# Patient Record
Sex: Male | Born: 1955
Health system: Southern US, Community
[De-identification: ages and names within clinical notes are randomized; demographics above are authoritative.]

## PROBLEM LIST (undated history)

## (undated) DIAGNOSIS — L732 Hidradenitis suppurativa: Secondary | ICD-10-CM

## (undated) DIAGNOSIS — K219 Gastro-esophageal reflux disease without esophagitis: Secondary | ICD-10-CM

## (undated) DIAGNOSIS — M1990 Unspecified osteoarthritis, unspecified site: Secondary | ICD-10-CM

## (undated) HISTORY — DX: Hidradenitis suppurativa: L73.2

## (undated) HISTORY — PX: OTHER SURGICAL HISTORY: SHX169

## (undated) HISTORY — DX: Gastro-esophageal reflux disease without esophagitis: K21.9

## (undated) HISTORY — DX: Unspecified osteoarthritis, unspecified site: M19.90

---

## 2005-08-30 ENCOUNTER — Encounter: Admission: RE | Admit: 2005-08-30 | Discharge: 2005-08-30 | Payer: Self-pay | Admitting: Family Medicine

## 2006-01-30 ENCOUNTER — Ambulatory Visit: Payer: Self-pay | Admitting: Family Medicine

## 2007-11-08 ENCOUNTER — Ambulatory Visit: Payer: Self-pay | Admitting: Family Medicine

## 2007-11-11 ENCOUNTER — Ambulatory Visit: Payer: Self-pay | Admitting: Family Medicine

## 2007-11-25 ENCOUNTER — Ambulatory Visit: Payer: Self-pay | Admitting: Family Medicine

## 2008-07-21 ENCOUNTER — Ambulatory Visit: Payer: Self-pay | Admitting: Family Medicine

## 2009-06-23 ENCOUNTER — Ambulatory Visit: Payer: Self-pay | Admitting: Family Medicine

## 2010-05-03 ENCOUNTER — Ambulatory Visit: Payer: Self-pay | Admitting: Family Medicine

## 2010-06-26 HISTORY — PX: KNEE ARTHROSCOPY: SHX127

## 2010-09-05 ENCOUNTER — Ambulatory Visit (INDEPENDENT_AMBULATORY_CARE_PROVIDER_SITE_OTHER): Payer: BC Managed Care – PPO | Admitting: Family Medicine

## 2010-09-05 DIAGNOSIS — M129 Arthropathy, unspecified: Secondary | ICD-10-CM

## 2010-09-09 ENCOUNTER — Emergency Department (HOSPITAL_COMMUNITY): Payer: No Typology Code available for payment source

## 2010-09-09 ENCOUNTER — Emergency Department (HOSPITAL_COMMUNITY)
Admission: EM | Admit: 2010-09-09 | Discharge: 2010-09-09 | Disposition: A | Discharge status: Home / Self Care | Payer: No Typology Code available for payment source | Attending: Emergency Medicine | Admitting: Emergency Medicine

## 2010-09-09 DIAGNOSIS — T148XXA Other injury of unspecified body region, initial encounter: Secondary | ICD-10-CM | POA: Insufficient documentation

## 2010-09-09 DIAGNOSIS — Y929 Unspecified place or not applicable: Secondary | ICD-10-CM | POA: Insufficient documentation

## 2010-09-09 DIAGNOSIS — R109 Unspecified abdominal pain: Secondary | ICD-10-CM | POA: Insufficient documentation

## 2010-09-09 DIAGNOSIS — M25569 Pain in unspecified knee: Secondary | ICD-10-CM | POA: Insufficient documentation

## 2010-09-09 DIAGNOSIS — M545 Low back pain, unspecified: Secondary | ICD-10-CM | POA: Insufficient documentation

## 2010-09-09 DIAGNOSIS — S0990XA Unspecified injury of head, initial encounter: Secondary | ICD-10-CM | POA: Insufficient documentation

## 2010-09-09 DIAGNOSIS — M79609 Pain in unspecified limb: Secondary | ICD-10-CM | POA: Insufficient documentation

## 2010-09-09 DIAGNOSIS — S93409A Sprain of unspecified ligament of unspecified ankle, initial encounter: Secondary | ICD-10-CM | POA: Insufficient documentation

## 2010-09-09 DIAGNOSIS — M1990 Unspecified osteoarthritis, unspecified site: Secondary | ICD-10-CM | POA: Insufficient documentation

## 2010-09-09 DIAGNOSIS — M542 Cervicalgia: Secondary | ICD-10-CM | POA: Insufficient documentation

## 2010-09-12 ENCOUNTER — Ambulatory Visit (INDEPENDENT_AMBULATORY_CARE_PROVIDER_SITE_OTHER): Payer: BC Managed Care – PPO | Admitting: Family Medicine

## 2011-08-07 ENCOUNTER — Emergency Department (HOSPITAL_COMMUNITY)
Admission: EM | Admit: 2011-08-07 | Discharge: 2011-08-07 | Disposition: A | Discharge status: Home / Self Care | Payer: Worker's Compensation | Attending: Emergency Medicine | Admitting: Emergency Medicine

## 2011-08-07 ENCOUNTER — Encounter (HOSPITAL_COMMUNITY): Payer: Self-pay

## 2011-08-07 DIAGNOSIS — H10219 Acute toxic conjunctivitis, unspecified eye: Secondary | ICD-10-CM | POA: Insufficient documentation

## 2011-08-07 DIAGNOSIS — H5710 Ocular pain, unspecified eye: Secondary | ICD-10-CM | POA: Insufficient documentation

## 2011-08-07 MED ORDER — TETRACAINE HCL 0.5 % OP SOLN
OPHTHALMIC | Status: AC
  Administered 2011-08-07: 1 [drp]
  Filled 2011-08-07: qty 2

## 2011-08-07 MED ORDER — FLUORESCEIN SODIUM 1 MG OP STRP
ORAL_STRIP | OPHTHALMIC | Status: AC
  Administered 2011-08-07: 08:00:00
  Filled 2011-08-07: qty 1

## 2011-08-07 NOTE — ED Notes (Signed)
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

## 2011-08-07 NOTE — ED Notes (Signed)
Pt presents to department for evaluation of R eye injury. States he was working on pipe this morning when liquid got into eye. States this could of been some type of grease or oil. Eye noted to be red and watery upon exam. Pt also states blurred vision and discomfort. 5/10 at the time. He is alert and oriented x4. No signs of acute distress at the time.

## 2011-08-07 NOTE — ED Provider Notes (Signed)
History     CSN: [id number]  Arrival date & time 08/07/11  6962   None     Chief Complaint  Patient presents with  . Eye Problem    (Consider location/radiation/quality/duration/timing/severity/associated sxs/prior treatment) HPI Complains of right eye stinging sensation onset 4:30 AM today when a type burst and liquid diet and his right eye. No other injury no other complaint treated with rinsing his eye out immediately. No other associated symptoms. No other injury History reviewed. No pertinent past medical history. Past medical history negative History reviewed. No pertinent past surgical history.  History reviewed. No pertinent family history.  History  Substance Use Topics  . Smoking status: Never Smoker   . Smokeless tobacco: Not on file  . Alcohol Use: No      Review of Systems  Constitutional: Negative.   Eyes:       Eye pain  Respiratory: Negative.   Hematological: Negative.   Psychiatric/Behavioral: Negative.     Allergies  Review of patient's allergies indicates no known allergies.  Home Medications  No current outpatient prescriptions on file.  BP 162/94  Pulse 79  Temp(Src) 97.9 F (36.6 C) (Oral)  Resp 20  SpO2 98%  Physical Exam  Nursing note and vitals reviewed. Constitutional: He appears well-developed and well-nourished.  HENT:  Head: Normocephalic and atraumatic.  Eyes: EOM are normal. Pupils are equal, round, and reactive to light.  Neck: Normal range of motion.  Cardiovascular: Normal rate.   Pulmonary/Chest: Effort normal.  Abdominal: He exhibits no distension.  Musculoskeletal: Normal range of motion.  Neurological: He is alert. No cranial nerve deficit. Coordination normal.  Skin: Skin is warm and dry.  Psychiatric: He has a normal mood and affect. His behavior is normal.    ED Course  Procedures (including critical care time) I your did with 1 L of saline with Lequita Halt lens. After I your did pH checked 7.5 approximATELY,  fluorescein past negative Labs Reviewed - No data to display No results found.   No diagnosis found.    MDM  Plan followup Worker's Comp. Doctor Diagnosis chemical conjunctivitis        Doug Sou, MD 08/07/11 [phone]

## 2011-10-16 ENCOUNTER — Ambulatory Visit (INDEPENDENT_AMBULATORY_CARE_PROVIDER_SITE_OTHER): Payer: BC Managed Care – PPO | Admitting: Family Medicine

## 2011-10-16 ENCOUNTER — Encounter: Payer: Self-pay | Admitting: Family Medicine

## 2011-10-16 VITALS — BP 130/78 | HR 77 | Wt 230.0 lb

## 2011-10-16 DIAGNOSIS — M25561 Pain in right knee: Secondary | ICD-10-CM

## 2011-10-16 DIAGNOSIS — M25569 Pain in unspecified knee: Secondary | ICD-10-CM

## 2011-10-16 NOTE — Patient Instructions (Signed)
Take 2 Tylenol 4 times a day. You can also take 4 Advil 3 times a day. If that doesn't work call me and we'll try a different medicine. If that doesn't work then we'll inject.

## 2011-10-16 NOTE — Progress Notes (Signed)
  Subjective:    Patient ID: Travis Solomon, male    DOB: [birth year], 56 y.o.   MRN: [id number]  HPI 3 days ago he developed right knee pain. No history of injury to the knee. No popping, locking, grinding or swelling. 2 days later the left medial knee started to give him trouble. He has had previous surgery on that knee. No other joints bother him. He has been taking 2 or 3 Advil per day. Apparently he has had x-rays on his knees at the orthopedic office and arthritis was mentioned. He is interested in having an injection.  Review of Systems     Objective:   Physical Exam Alert and in no distress. Exam of the right knee does show a small effusion. Anterior drawer is negative. McMurray's testing normal. Slight lipping is noted of the joints.       Assessment & Plan:  Right knee pain, probable arthritis. I explained that an injection would not be appropriate at this time. We need to start with Tylenol and add an anti-inflammatory in maximum dosing. Discussed possibility of doing an injection but at a later date after retry at least one other anti-inflammatory. He was disappointed with this.

## 2013-09-23 ENCOUNTER — Telehealth: Payer: Self-pay

## 2013-09-23 NOTE — Telephone Encounter (Signed)
Left message for call back Non identifiable No pertinent data

## 2013-09-24 ENCOUNTER — Encounter: Payer: Self-pay | Admitting: Internal Medicine

## 2013-09-24 ENCOUNTER — Ambulatory Visit (INDEPENDENT_AMBULATORY_CARE_PROVIDER_SITE_OTHER): Payer: BC Managed Care – PPO | Admitting: Internal Medicine

## 2013-09-24 VITALS — BP 135/79 | HR 66 | Temp 97.9°F | Ht 70.2 in | Wt 239.0 lb

## 2013-09-24 DIAGNOSIS — R079 Chest pain, unspecified: Secondary | ICD-10-CM | POA: Insufficient documentation

## 2013-09-24 NOTE — Assessment & Plan Note (Addendum)
2 weeks history of chest pain, atypical. EKG normal sinus rhythm. Cardiovascular risk factors are low, lipids unknown. Given lack of competing  diagnosis I will proceed with a stress test (pt states treadmill is ok) and a chest x-ray. Labs  See instructions.

## 2013-09-24 NOTE — Patient Instructions (Signed)
Get the XR at [facility] [place], corner [address] (10 minutes form here); they are open 24/7 [address]  [place], [place] [address] [phone]  Continue with  aspirin Call or go to the ER  if you have severe or intense or persistent chest pain.  Come back for a checkup in 2 months

## 2013-09-24 NOTE — Progress Notes (Signed)
   Subjective:    Patient ID: Travis Solomon, male    DOB: [birth year], 58 y.o.   MRN: [id number]  DOS:  09/24/2013 Type of  Visit: New patient Patient is here concerned about chest pain. Symptoms started approximately 2 weeks ago, pain is on and off, last for a few seconds to a few minutes. Usually at rest or when he is driving. No change in sx w/ exercise, walking, food intake, deep breaths. He had similar symptoms 2 years ago (?)  but now workup apparently was done. No associated nausea, vomiting, palpitations. Pain does not radiate.   ROS Denies fever, chills. No cough. Denies heartburn but when asked, admits to occasional dysphagia. Note nausea, vomiting, blood in the stools. He is a [employer] architecttruck driver, he denies any calf swelling or pain.  History reviewed. No pertinent past medical history.  Past Surgical History  Procedure Laterality Date  . Knee arthroscopy  2012    History   Social History  . Marital Status: Married    Spouse Name: N/A    Number of Children: 3  . Years of Education: N/A   Occupational History  . truck driver     Social History Main Topics  . Smoking status: Never Smoker   . Smokeless tobacco: Never Used  . Alcohol Use: No  . Drug Use: No  . Sexual Activity: Not on file   Other Topics Concern  . Not on file   Social History Narrative  . No narrative on file     Family History  Problem Relation Age of Onset  . CAD Neg Hx   . Stroke Neg Hx   . Diabetes Other     mother side        Medication List       This list is accurate as of: 09/24/13  6:30 PM.  Always use your most recent med list.               aspirin 325 MG tablet  Take 325 mg by mouth daily.     ibuprofen 200 MG tablet  Commonly known as:  ADVIL,MOTRIN  Take 200 mg by mouth every 6 (six) hours as needed. For pain           Objective:   Physical Exam BP 135/79  Pulse 66  Temp(Src) 97.9 F (36.6 C)  Ht 5' 10.2" (1.783 m)  Wt 239 lb (108.41 kg)  BMI  34.10 kg/m2  SpO2 96% General -- alert, well-developed, NAD.  Neck --no thyromegaly   HEENT-- Not pale.  Lungs -- normal respiratory effort, no intercostal retractions, no accessory muscle use, and normal breath sounds.  Chest wall -- No TTP Heart-- normal rate, regular rhythm, no murmur.  Abdomen-- Not distended, good bowel sounds,soft, non-tender.  Extremities-- no pretibial edema bilaterally  Neurologic--  alert & oriented X3. Speech normal, gait normal, strength normal in all extremities.  Psych-- Cognition and judgment appear intact. Cooperative with normal attention span and concentration. No anxious or depressed appearing.       Assessment & Plan:

## 2013-09-24 NOTE — Progress Notes (Signed)
Pre visit review using our clinic review tool, if applicable. No additional management support is needed unless otherwise documented below in the visit note. 

## 2013-09-25 ENCOUNTER — Telehealth: Payer: Self-pay | Admitting: *Deleted

## 2013-09-25 LAB — CBC WITH DIFFERENTIAL/PLATELET
Basophils Absolute: 0 10*3/uL (ref 0.0–0.1)
Basophils Relative: 0.2 % (ref 0.0–3.0)
EOS PCT: 4.8 % (ref 0.0–5.0)
Eosinophils Absolute: 0.3 10*3/uL (ref 0.0–0.7)
HEMATOCRIT: 41.3 % (ref 39.0–52.0)
Hemoglobin: 12.9 g/dL — ABNORMAL LOW (ref 13.0–17.0)
Lymphocytes Relative: 42.9 % (ref 12.0–46.0)
Lymphs Abs: 2.6 10*3/uL (ref 0.7–4.0)
MCHC: 31.3 g/dL (ref 30.0–36.0)
MCV: 71.4 fl — AB (ref 78.0–100.0)
MONOS PCT: 8.5 % (ref 3.0–12.0)
Monocytes Absolute: 0.5 10*3/uL (ref 0.1–1.0)
NEUTROS PCT: 43.6 % (ref 43.0–77.0)
Neutro Abs: 2.6 10*3/uL (ref 1.4–7.7)
PLATELETS: 351 10*3/uL (ref 150.0–400.0)
RBC: 5.78 Mil/uL (ref 4.22–5.81)
RDW: 15.7 % — AB (ref 11.5–14.6)
WBC: 6 10*3/uL (ref 4.5–10.5)

## 2013-09-25 LAB — BASIC METABOLIC PANEL
BUN: 12 mg/dL (ref 6–23)
CHLORIDE: 101 meq/L (ref 96–112)
CO2: 28 mEq/L (ref 19–32)
CREATININE: 0.9 mg/dL (ref 0.4–1.5)
Calcium: 9.4 mg/dL (ref 8.4–10.5)
GFR: 110.69 mL/min (ref >60.00)
GLUCOSE: 78 mg/dL (ref 70–99)
POTASSIUM: 4.5 meq/L (ref 3.5–5.1)
Sodium: 138 mEq/L (ref 135–145)

## 2013-09-25 NOTE — Telephone Encounter (Signed)
Appt. Scheduled @ [facility] office. Left message for patient to call office.

## 2013-09-25 NOTE — Telephone Encounter (Signed)
Unable to reach prior to visit  

## 2013-09-25 NOTE — Telephone Encounter (Signed)
Linda from [facility] called regarding pts stress test unable to schedule him until MAY, recommended another [facility] [address] that can schedule him possibly next week.

## 2013-09-30 ENCOUNTER — Telehealth: Payer: Self-pay | Admitting: *Deleted

## 2013-09-30 ENCOUNTER — Other Ambulatory Visit (INDEPENDENT_AMBULATORY_CARE_PROVIDER_SITE_OTHER): Payer: BC Managed Care – PPO

## 2013-09-30 DIAGNOSIS — D649 Anemia, unspecified: Secondary | ICD-10-CM

## 2013-09-30 NOTE — Telephone Encounter (Signed)
Release of medical information signed by patient and faxed to pts last physicians [facility].

## 2013-10-01 LAB — COMPREHENSIVE METABOLIC PANEL
ALT: 18 U/L (ref 0–53)
AST: 21 U/L (ref 0–37)
Albumin: 4 g/dL (ref 3.5–5.2)
Alkaline Phosphatase: 53 U/L (ref 39–117)
BILIRUBIN TOTAL: 0.4 mg/dL (ref 0.3–1.2)
BUN: 11 mg/dL (ref 6–23)
CALCIUM: 9.3 mg/dL (ref 8.4–10.5)
CO2: 27 meq/L (ref 19–32)
CREATININE: 1 mg/dL (ref 0.4–1.5)
Chloride: 102 mEq/L (ref 96–112)
GFR: 105.32 mL/min (ref >60.00)
Glucose, Bld: 85 mg/dL (ref 70–99)
Potassium: 4.2 mEq/L (ref 3.5–5.1)
SODIUM: 140 meq/L (ref 135–145)
TOTAL PROTEIN: 7.4 g/dL (ref 6.0–8.3)

## 2013-10-01 LAB — FERRITIN: Ferritin: 188.2 ng/mL (ref 22.0–322.0)

## 2013-10-01 LAB — IRON: Iron: 67 ug/dL (ref 42–165)

## 2013-10-02 ENCOUNTER — Inpatient Hospital Stay (HOSPITAL_COMMUNITY): Admission: RE | Admit: 2013-10-02 | Payer: Self-pay | Source: Ambulatory Visit

## 2013-10-22 ENCOUNTER — Telehealth: Payer: Self-pay | Admitting: Internal Medicine

## 2013-10-22 NOTE — Telephone Encounter (Signed)
Fax[facility] records [facility] @ [phone]

## 2013-10-23 ENCOUNTER — Telehealth (HOSPITAL_COMMUNITY): Payer: Self-pay

## 2013-10-28 ENCOUNTER — Ambulatory Visit (HOSPITAL_COMMUNITY)
Admission: RE | Admit: 2013-10-28 | Discharge: 2013-10-28 | Disposition: A | Discharge status: Home / Self Care | Payer: BC Managed Care – PPO | Source: Ambulatory Visit | Attending: Cardiovascular Disease | Admitting: Cardiovascular Disease

## 2013-10-28 DIAGNOSIS — R079 Chest pain, unspecified: Secondary | ICD-10-CM

## 2013-10-29 ENCOUNTER — Telehealth: Payer: Self-pay | Admitting: Internal Medicine

## 2013-10-29 NOTE — Telephone Encounter (Signed)
Spoke with patient and the pain has gone away and he feels better. He will call us and let us know if there are any changes.      KP

## 2013-10-29 NOTE — Telephone Encounter (Signed)
Advise patient, stress test negative, patient to let me know if he continue w/ CP. Good results

## 2013-10-29 NOTE — Telephone Encounter (Signed)
MSG left to call the office      KP 

## 2013-11-04 ENCOUNTER — Telehealth: Payer: Self-pay | Admitting: Family Medicine

## 2013-11-04 NOTE — Telephone Encounter (Signed)
Medical records sent to [facility]

## 2013-11-24 ENCOUNTER — Encounter: Payer: Self-pay | Admitting: Internal Medicine

## 2013-11-24 ENCOUNTER — Ambulatory Visit (INDEPENDENT_AMBULATORY_CARE_PROVIDER_SITE_OTHER): Payer: BC Managed Care – PPO | Admitting: Internal Medicine

## 2013-11-24 VITALS — BP 124/78 | HR 75 | Temp 98.2°F | Wt 243.0 lb

## 2013-11-24 DIAGNOSIS — R079 Chest pain, unspecified: Secondary | ICD-10-CM

## 2013-11-24 DIAGNOSIS — M1990 Unspecified osteoarthritis, unspecified site: Secondary | ICD-10-CM

## 2013-11-24 HISTORY — DX: Unspecified osteoarthritis, unspecified site: M19.90

## 2013-11-24 NOTE — Patient Instructions (Signed)
Next visit is for a physical exam in 6-8 months   fasting Please make an appointment

## 2013-11-24 NOTE — Assessment & Plan Note (Signed)
We had a long conversation about DJD, he has seen Dr. Juliene Pina  before and had local injections, they already talked about surgery but he's not interested at this time, unless  is minimally invasive surgery. For now I recommend conservative treatment with Tylenol or Motrin, call for a referral to [facility] if he likes a second opinion

## 2013-11-24 NOTE — Progress Notes (Signed)
Pre visit review using our clinic review tool, if applicable. No additional management support is needed unless otherwise documented below in the visit note. 

## 2013-11-24 NOTE — Assessment & Plan Note (Addendum)
Stress test was negative, chest pain is resolved. He did not have a CXR done but since he is now asymptomatic won't pursue further evaluation unless symptoms resurface.

## 2013-11-24 NOTE — Progress Notes (Signed)
   Subjective:    Patient ID: Travis Solomon, male    DOB: January 14, 1956, 58 y.o.   MRN: [id number]  DOS:  11/24/2013 Type of  Visit: Followup from previous visit History: As the last time he was here, labs were okay, stress test was negative, no further testing. Also he is somehow concerned about knee DJD. See assessment and plan   ROS Denies any fever or chills No weight loss, no cough. No acid reflux   Past Medical History  Diagnosis Date  . DJD (degenerative joint disease) 11/24/2013    Past Surgical History  Procedure Laterality Date  . Knee arthroscopy  2012    History   Social History  . Marital Status: Married    Spouse Name: N/A    Number of Children: 3  . Years of Education: N/A   Occupational History  . truck driver     Social History Main Topics  . Smoking status: Never Smoker   . Smokeless tobacco: Never Used  . Alcohol Use: No  . Drug Use: No  . Sexual Activity: Not on file   Other Topics Concern  . Not on file   Social History Narrative  . No narrative on file        Medication List       This list is accurate as of: 11/24/13 10:03 PM.  Always use your most recent med list.               aspirin 325 MG tablet  Take 325 mg by mouth daily.     ibuprofen 200 MG tablet  Commonly known as:  ADVIL,MOTRIN  Take 200 mg by mouth every 6 (six) hours as needed. For pain           Objective:   Physical Exam BP 124/78  Pulse 75  Temp(Src) 98.2 F (36.8 C)  Wt 243 lb (110.224 kg)  SpO2 97%  General -- alert, well-developed, NAD.  Neck --no mass HEENT-- Not pale.  Lungs -- normal respiratory effort, no intercostal retractions, no accessory muscle use, and normal breath sounds.  Heart-- normal rate, regular rhythm, no murmur.   Extremities-- no pretibial edema bilaterally . + deformities c/w DJD at knees  Neurologic--  alert & oriented X3. Speech normal, gait appropriate for age but limited by DJD  Psych-- Cognition and judgment  appear intact. Cooperative with normal attention span and concentration. No anxious or depressed appearing.       Assessment & Plan:

## 2013-12-25 NOTE — Telephone Encounter (Signed)
Encounter complete. 

## 2014-07-20 ENCOUNTER — Encounter: Payer: Self-pay | Admitting: Internal Medicine

## 2015-07-01 ENCOUNTER — Telehealth: Payer: Self-pay | Admitting: Internal Medicine

## 2015-07-01 NOTE — Telephone Encounter (Signed)
error:[id number] ° °

## 2015-07-02 ENCOUNTER — Other Ambulatory Visit: Payer: Self-pay | Admitting: Medical

## 2015-07-02 ENCOUNTER — Encounter (INDEPENDENT_AMBULATORY_CARE_PROVIDER_SITE_OTHER): Payer: Self-pay

## 2015-07-02 ENCOUNTER — Ambulatory Visit (INDEPENDENT_AMBULATORY_CARE_PROVIDER_SITE_OTHER): Payer: Managed Care, Other (non HMO) | Admitting: Medical

## 2015-07-02 ENCOUNTER — Encounter: Payer: Self-pay | Admitting: Medical

## 2015-07-02 VITALS — BP 130/88 | HR 78 | Temp 97.8°F | Ht 70.0 in | Wt 246.0 lb

## 2015-07-02 DIAGNOSIS — R0781 Pleurodynia: Secondary | ICD-10-CM | POA: Diagnosis not present

## 2015-07-02 DIAGNOSIS — R10A0 Flank pain, unspecified side: Secondary | ICD-10-CM

## 2015-07-02 DIAGNOSIS — R109 Unspecified abdominal pain: Secondary | ICD-10-CM | POA: Diagnosis not present

## 2015-07-02 DIAGNOSIS — R0789 Other chest pain: Secondary | ICD-10-CM

## 2015-07-02 LAB — COMPREHENSIVE METABOLIC PANEL
ALBUMIN: 4.4 g/dL (ref 3.6–5.1)
ALK PHOS: 71 U/L (ref 40–115)
ALT: 17 U/L (ref 9–46)
AST: 19 U/L (ref 10–35)
BUN: 15 mg/dL (ref 7–25)
CALCIUM: 9.2 mg/dL (ref 8.6–10.3)
CO2: 30 mmol/L (ref 20–31)
Chloride: 101 mmol/L (ref 98–110)
Creat: 0.99 mg/dL (ref 0.70–1.33)
Glucose, Bld: 87 mg/dL (ref 65–99)
POTASSIUM: 4.7 mmol/L (ref 3.5–5.3)
Sodium: 138 mmol/L (ref 135–146)
TOTAL PROTEIN: 7.3 g/dL (ref 6.1–8.1)
Total Bilirubin: 0.4 mg/dL (ref 0.2–1.2)

## 2015-07-02 LAB — CBC WITH DIFFERENTIAL/PLATELET
BASOS PCT: 0 % (ref 0–1)
Basophils Absolute: 0 10*3/uL (ref 0.0–0.1)
Eosinophils Absolute: 0.2 10*3/uL (ref 0.0–0.7)
Eosinophils Relative: 3 % (ref 0–5)
HCT: 41.6 % (ref 39.0–52.0)
HEMOGLOBIN: 13.4 g/dL (ref 13.0–17.0)
Lymphocytes Relative: 26 % (ref 12–46)
Lymphs Abs: 2.2 10*3/uL (ref 0.7–4.0)
MCH: 22.4 pg — ABNORMAL LOW (ref 26.0–34.0)
MCHC: 32.2 g/dL (ref 30.0–36.0)
MCV: 69.7 fL — ABNORMAL LOW (ref 78.0–100.0)
MONO ABS: 0.8 10*3/uL (ref 0.1–1.0)
MPV: 8 fL — AB (ref 8.6–12.4)
Monocytes Relative: 10 % (ref 3–12)
NEUTROS ABS: 5.1 10*3/uL (ref 1.7–7.7)
Neutrophils Relative %: 61 % (ref 43–77)
Platelets: 303 10*3/uL (ref 150–400)
RBC: 5.97 MIL/uL — AB (ref 4.22–5.81)
RDW: 16.5 % — AB (ref 11.5–15.5)
WBC: 8.3 10*3/uL (ref 4.0–10.5)

## 2015-07-02 MED ORDER — TRAMADOL HCL 50 MG PO TABS: 50.0000 mg | ORAL_TABLET | Freq: Three times a day (TID) | ORAL | Status: DC | PRN

## 2015-07-02 MED FILL — traMADol HCL 50 MG TABS: 50 | 5 days supply | Qty: 16 | Fill #0

## 2015-07-02 NOTE — Patient Instructions (Addendum)
For rib area pain and flank area pain continue take tylenol. Can continue flexeril. For moderate to severe pain rx tramadol.  Since on exam you have some slight lower rib area pain will get xray.  I will put in order to get cmp and cbc next week Monday or Tuesday when we are open depending on weather. Lab was closed at time of visit.  If your symptoms worsen or change over weekend then ED evaluation.  Above was my plan. But on the way out he was able to get blood work but then did not get xray. When I called to clarify when his pain started he mentioned he did not get xray. So I advised him by Monday or Tuesday any pain persisting then get xray. Orders are in computer and he does not need appointment.

## 2015-07-02 NOTE — Progress Notes (Signed)
Subjective:    Patient ID: Travis Solomon, male    DOB: Nov 09, 1955, 60 y.o.   MRN: [id number]  HPI   Pt in with some left side back pain/lower rib area for one week. Pt states felt pain at work bending over holding some items around 40-50 pounds. Pt saw workers comp MD. He was told muscle strain. Pt is urinating fine. No abdomen pain. No nausea, no vomiting, or fever.   Pt was given a muscle relaxer and ibuprofen. When he lays down on his side feels burn. Pain some improved.  Review of Systems  Constitutional: Negative for fever, chills and fatigue.  Respiratory: Negative for cough, chest tightness, shortness of breath and wheezing.   Cardiovascular: Negative for chest pain and palpitations.  Gastrointestinal: Negative for nausea, vomiting, abdominal pain, diarrhea, blood in stool and abdominal distention.  Genitourinary: Negative for dysuria, urgency, frequency, flank pain, penile swelling and penile pain.       Urinating normal.  Musculoskeletal:       See hpi.  Skin: Negative for rash.  Neurological: Negative for dizziness and headaches.  Hematological: Negative for adenopathy. Does not bruise/bleed easily.  Psychiatric/Behavioral: Negative for behavioral problems.    Past Medical History  Diagnosis Date  . DJD (degenerative joint disease) 11/24/2013    Social History   Social History  . Marital Status: Married    Spouse Name: N/A  . Number of Children: 3  . Years of Education: N/A   Occupational History  . truck driver     Social History Main Topics  . Smoking status: Never Smoker   . Smokeless tobacco: Never Used  . Alcohol Use: No  . Drug Use: No  . Sexual Activity: Not on file   Other Topics Concern  . Not on file   Social History Narrative    Past Surgical History  Procedure Laterality Date  . Knee arthroscopy  2012    Family History  Problem Relation Age of Onset  . CAD Neg Hx   . Stroke Neg Hx   . Diabetes Other     mother side      No Known Allergies  Current Outpatient Prescriptions on File Prior to Visit  Medication Sig Dispense Refill  . aspirin 325 MG tablet Take 325 mg by mouth daily.    Marland Kitchen ibuprofen (ADVIL,MOTRIN) 200 MG tablet Take 200 mg by mouth every 6 (six) hours as needed. For pain     No current facility-administered medications on file prior to visit.    BP 130/88 mmHg  Pulse 78  Temp(Src) 97.8 F (36.6 C) (Oral)  Ht 5\' 10"  (1.778 m)  Wt 246 lb (111.585 kg)  BMI 35.30 kg/m2  SpO2 97%       Objective:   Physical Exam  General  Mental Status - Alert. General Appearance - Well groomed. Not in acute distress.  Skin Rashes- No Rashes.    Neck Neck- Supple. No Masses.   Chest and Lung Exam Auscultation: Breath Sounds:- even and unlabored,  Cardiovascular Auscultation:Rythm- Regular, rate and rhythm. Murmurs & Other Heart Sounds:Ausculatation of the heart reveal- No Murmurs.  Lymphatic Head & Neck General Head & Neck Lymphatics: Bilateral: Description- No Localized lymphadenopathy.  Abdomen Inspection:-Inspection Normal.  Palpation/Perucssion: Palpation and Percussion of the abdomen reveal- Non Tender, No Rebound tenderness, No rigidity(Guarding) and No Palpable abdominal masses.  Liver:-Normal.  Spleen:- Normal.   Back- no cva pain.  Thorax- on palpation of left lower rib and area above lt  pelvic rim pain directly on palpation.  Skin- no rash, no redness or warmth.          Assessment & Plan:  For rib area pain and flank area pain continue take tylenol. Can continue flexeril. For moderate to severe pain rx tramadol.  Since on exam you have some slight lower rib area pain will get xray.  I will put in order to get cmp and cbc next week Monday or Tuesday when we are open depending on weather. Lab was closed at time of visit.  If your symptoms worsen or change over weekend then ED evaluation.  Above was my plan. But on the way out he was able to get blood  work but then did not get xray. When I called to clarify when his pain started he mentioned he did not get xray. So I advised him by Monday or Tuesday any pain persisting then get xray. Orders are in computer and he does not need appointment.

## 2015-07-02 NOTE — Progress Notes (Signed)
Pre visit review using our clinic review tool, if applicable. No additional management support is needed unless otherwise documented below in the visit note. 

## 2015-07-06 LAB — CREATININE WITH EST GFR
Creat: 0.91 mg/dL (ref 0.70–1.33)
GFR, Est African American: >89 mL/min (ref >=60)

## 2016-03-01 ENCOUNTER — Emergency Department (HOSPITAL_COMMUNITY)
Admission: EM | Admit: 2016-03-01 | Discharge: 2016-03-01 | Disposition: A | Discharge status: Home / Self Care | Payer: Worker's Compensation | Attending: Emergency Medicine | Admitting: Emergency Medicine

## 2016-03-01 ENCOUNTER — Encounter (HOSPITAL_COMMUNITY): Payer: Self-pay | Admitting: Emergency Medicine

## 2016-03-01 DIAGNOSIS — Y990 Civilian activity done for income or pay: Secondary | ICD-10-CM | POA: Insufficient documentation

## 2016-03-01 DIAGNOSIS — X58XXXA Exposure to other specified factors, initial encounter: Secondary | ICD-10-CM | POA: Diagnosis not present

## 2016-03-01 DIAGNOSIS — Y929 Unspecified place or not applicable: Secondary | ICD-10-CM | POA: Insufficient documentation

## 2016-03-01 DIAGNOSIS — Y939 Activity, unspecified: Secondary | ICD-10-CM | POA: Insufficient documentation

## 2016-03-01 DIAGNOSIS — S0501XA Injury of conjunctiva and corneal abrasion without foreign body, right eye, initial encounter: Secondary | ICD-10-CM | POA: Diagnosis not present

## 2016-03-01 DIAGNOSIS — S0591XA Unspecified injury of right eye and orbit, initial encounter: Secondary | ICD-10-CM | POA: Diagnosis present

## 2016-03-01 DIAGNOSIS — Z7982 Long term (current) use of aspirin: Secondary | ICD-10-CM | POA: Insufficient documentation

## 2016-03-01 MED ORDER — TETRACAINE HCL 0.5 % OP SOLN
1.0000 [drp] | Freq: Once | OPHTHALMIC | Status: AC
  Administered 2016-03-01: 1 [drp] via OPHTHALMIC
  Filled 2016-03-01: qty 2

## 2016-03-01 MED ORDER — FLUORESCEIN SODIUM 1 MG OP STRP
1.0000 | ORAL_STRIP | Freq: Once | OPHTHALMIC | Status: AC
  Administered 2016-03-01: 1 via OPHTHALMIC
  Filled 2016-03-01: qty 1

## 2016-03-01 MED ORDER — ERYTHROMYCIN 5 MG/GM OP OINT: TOPICAL_OINTMENT | OPHTHALMIC | 0 refills | Status: DC

## 2016-03-01 NOTE — ED Triage Notes (Signed)
Pt. reports grease accidentally splattered at right eye while at work this morning , denies vision loss or blurred vision , reports mild right eye pain / no drainage .

## 2016-03-01 NOTE — ED Provider Notes (Signed)
[facility]-EMERGENCY DEPT Provider Note   CSN: [id number] Arrival date & time: 03/01/16  0422     History   Chief Complaint Chief Complaint  Patient presents with  . Eye Injury    HPI Travis Solomon is a 60 y.o. male.  The history is provided by the patient and medical records. No language interpreter was used.  Eye Injury  Pertinent negatives include no chest pain, no abdominal pain, no headaches and no shortness of breath.   Travis Solomon is a 60 y.o. male  who presents to the Emergency Department complaining of persistent right eye burning that began after he accidentally splattered grease in his eye at work this morning around 2am. Flushed eye with OTC red-eye drops immediately after incident with some relief. He denies any change in vision. No discharge, foreign body sensation or pain with eye movement.   Past Medical History:  Diagnosis Date  . DJD (degenerative joint disease) 11/24/2013    Patient Active Problem List   Diagnosis Date Noted  . DJD (degenerative joint disease) 11/24/2013  . Chest pain 09/24/2013    Past Surgical History:  Procedure Laterality Date  . KNEE ARTHROSCOPY  2012       Home Medications    Prior to Admission medications   Medication Sig Start Date End Date Taking? Authorizing Provider  aspirin 325 MG tablet Take 325 mg by mouth daily.    Historical Provider, MD  erythromycin ophthalmic ointment Place a 1/2 inch ribbon of ointment into the right lower eyelid three to four times daily. 03/01/16   Chase PicketJaime Pilcher Ward, PA-C  ibuprofen (ADVIL,MOTRIN) 200 MG tablet Take 200 mg by mouth every 6 (six) hours as needed. For pain    Historical Provider, MD  traMADol (ULTRAM) 50 MG tablet Take 1 tablet (50 mg total) by mouth every 8 (eight) hours as needed. 07/02/15   Esperanza RichtersEdward Saguier, PA-C    Family History Family History  Problem Relation Age of Onset  . Diabetes Other     mother side   . CAD Neg Hx   . Stroke Neg Hx     Social  History Social History  Substance Use Topics  . Smoking status: Never Smoker  . Smokeless tobacco: Never Used  . Alcohol use No     Allergies   Review of patient's allergies indicates no known allergies.   Review of Systems Review of Systems  Constitutional: Negative for fever.  HENT: Negative for trouble swallowing.   Eyes: Positive for pain and redness. Negative for photophobia, discharge and visual disturbance.  Respiratory: Negative for shortness of breath.   Cardiovascular: Negative for chest pain.  Gastrointestinal: Negative for abdominal pain.  Genitourinary: Negative for dysuria.  Musculoskeletal: Negative for myalgias.  Allergic/Immunologic: Negative for immunocompromised state.  Neurological: Negative for headaches.     Physical Exam Updated Vital Signs BP 155/96 (BP Location: Left Arm)   Pulse 78   Temp 98.3 F (36.8 C) (Oral)   Resp 16   Ht 5\' 8"  (1.727 m)   Wt 111.1 kg   SpO2 97%   BMI 37.25 kg/m   Physical Exam  Constitutional: He is oriented to person, place, and time. He appears well-developed and well-nourished. No distress.  HENT:  Head: Normocephalic and atraumatic.  Eyes: EOM and lids are normal. Pupils are equal, round, and reactive to light. Lids are everted and swept, no foreign bodies found. Right eye exhibits no discharge. No foreign body present in the right eye. Left eye  exhibits no discharge. No foreign body present in the left eye.  Slit lamp exam:      The right eye shows corneal abrasion and fluorescein uptake.  Cardiovascular: Normal rate, regular rhythm and normal heart sounds.   Pulmonary/Chest: Effort normal and breath sounds normal. No respiratory distress.  Abdominal: Soft. He exhibits no distension. There is no tenderness.  Musculoskeletal: He exhibits no edema.  Neurological: He is alert and oriented to person, place, and time.  Skin: Skin is warm and dry.  Nursing note and vitals reviewed.    ED Treatments / Results   Labs (all labs ordered are listed, but only abnormal results are displayed) Labs Reviewed - No data to display  EKG  EKG Interpretation None       Radiology No results found.  Procedures Procedures (including critical care time)  Medications Ordered in ED Medications  fluorescein ophthalmic strip 1 strip (1 strip Right Eye Given 03/01/16 0526)  tetracaine (PONTOCAINE) 0.5 % ophthalmic solution 1 drop (1 drop Right Eye Given 03/01/16 0526)     Initial Impression / Assessment and Plan / ED Course  I have reviewed the triage vital signs and the nursing notes.  Pertinent labs & imaging results that were available during my care of the patient were reviewed by me and considered in my medical decision making (see chart for details).  Clinical Course   Travis Solomon presents to ED for right eye burning pain that began after grease got into his eye at work around 2am this morning. Visual acuity reassuring. Patient denies visual changes. EOM intact and without pain. PERRLA. Eye thoroughly flushed with saline in ED. No foreign bodies appreciated on exam but he does have area of fluorescein uptake on lower aspect of right eye. Rx for Erythromycin ointment. Optho follow-up. Reasons to return to ED discussed and all questions answered.  Patient discussed with Dr. Elesa Massed who agrees with treatment plan.   Final Clinical Impressions(s) / ED Diagnoses   Final diagnoses:  Corneal abrasion, right, initial encounter    New Prescriptions New Prescriptions   ERYTHROMYCIN OPHTHALMIC OINTMENT    Place a 1/2 inch ribbon of ointment into the right lower eyelid three to four times daily.        [facility] Ward, PA-C 03/01/16 1610    Layla Maw Ward, DO 03/01/16 [phone]

## 2016-03-01 NOTE — Discharge Instructions (Signed)
Apply topical antibiotic ointment to the eye as directed.  Follow up with eye doctor of your choice or the eye doctor listed. Return to ER for changes in vision, discharge from the eye, new or worsening symptoms, any additional concerns.

## 2016-03-01 NOTE — ED Notes (Signed)
Pt provided with d/c instructions at this time. Pt verbalizes understanding of d/c instructions as well as follow up procedure after d/c.  Pt provided with RX for erythromycin ointment.  Pt verbalizes understanding of RX directions. Pt in no apparent distress at this time.  Pt ambulatory at time of d/c.

## 2016-05-24 ENCOUNTER — Encounter: Payer: Self-pay | Admitting: Internal Medicine

## 2016-05-24 ENCOUNTER — Ambulatory Visit (INDEPENDENT_AMBULATORY_CARE_PROVIDER_SITE_OTHER): Payer: Managed Care, Other (non HMO) | Admitting: Internal Medicine

## 2016-05-24 VITALS — BP 130/78 | HR 80 | Temp 98.0°F | Resp 14 | Ht 70.0 in | Wt 241.5 lb

## 2016-05-24 DIAGNOSIS — K219 Gastro-esophageal reflux disease without esophagitis: Secondary | ICD-10-CM

## 2016-05-24 DIAGNOSIS — J029 Acute pharyngitis, unspecified: Secondary | ICD-10-CM

## 2016-05-24 MED ORDER — OMEPRAZOLE 40 MG PO CPDR: 40.0000 mg | DELAYED_RELEASE_CAPSULE | Freq: Every day | ORAL | 3 refills | Status: DC

## 2016-05-24 NOTE — Progress Notes (Signed)
Pre visit review using our clinic review tool, if applicable. No additional management support is needed unless otherwise documented below in the visit note. 

## 2016-05-24 NOTE — Patient Instructions (Signed)
Take omeprazole 1 tablet every morning before breakfast x 4 to 6 weeks  If not better at the end of the treatment, call for a referral  To a ENT

## 2016-05-24 NOTE — Progress Notes (Signed)
Subjective:    Patient ID: Travis Solomon, male    DOB: 08-08-1955, 60 y.o.   MRN: [id number]  DOS:  05/24/2016 Type of visit - description : Acute visit. Chief complaint is "burning throat". Interval history: Reports that for the last 6 weeks he is having burning sensation in the throat and occasionally a dry cough. 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 Last week went to see a workers Chief Operating Officercompensation doctor and he was told he had a sinus problem. He does not believe it, thinks there something wrong with his throat.  Also, reports he has a staph infection and is taking minocycline for about a year  Review of Systems Denies fever, chills. No runny nose. No sneezing. No itchy eyes or nose. No sinus pain or congestion No difficulty breathing No classic heartburn. No dysphagia, odynophagia. No wheezing.  Past Medical History:  Diagnosis Date  . DJD (degenerative joint disease) 11/24/2013    Past Surgical History:  Procedure Laterality Date  . KNEE ARTHROSCOPY  2012    Social History   Social History  . Marital status: Married    Spouse name: N/A  . Number of children: 3  . Years of education: N/A   Occupational History  . truck driver  [employer]   Social History Main Topics  . Smoking status: Never Smoker  . Smokeless tobacco: Never Used  . Alcohol use No  . Drug use: No  . Sexual activity: Not on file   Other Topics Concern  . Not on file   Social History Narrative  . No narrative on file        Medication List       Accurate as of 05/24/16 11:59 PM. Always use your most recent med list.          ibuprofen 200 MG tablet Commonly known as:  ADVIL,MOTRIN Take 200 mg by mouth every 6 (six) hours as needed. For pain   omeprazole 40 MG capsule Commonly known as:  PRILOSEC Take 1 capsule (40 mg total) by mouth daily.          Objective:   Physical Exam BP 130/78 (BP Location: Left Arm, Patient Position: Sitting, Cuff Size:  Normal)   Pulse 80   Temp 98 F (36.7 C) (Oral)   Resp 14   Ht 5\' 10"  (1.778 m)   Wt 241 lb 8 oz (109.5 kg)   SpO2 97%   BMI 34.65 kg/m  General:   Well developed, well nourished . NAD.  HEENT:  Normocephalic . Face symmetric, atraumatic. Right ear obscured by wax. Left ear normal. Nose not congested, sinuses no TTP. Throat: Symmetric mild not read. Neck: No thyromegaly, no mass or LADs. Lungs:  CTA B Normal respiratory effort, no intercostal retractions, no accessory muscle use. Heart: RRR,  no murmur.  No pretibial edema bilaterally  Skin: Not pale. Not jaundice Neurologic:  alert & oriented X3.  Speech normal, gait appropriate for age and unassisted Psych--  Cognition and judgment appear intact.  Cooperative with normal attention span and concentration.  Behavior appropriate. No anxious or depressed appearing.      Assessment & Plan:   Assessment DJD 2015: Chest pain, stress test negative  Plan: GERD? Several weeks history of throat burning, exam is (-), no allergy sx, no classic reflux. He is a nonsmoker. He thinks is related to "fumes from the truck at work". Statistically sx are likely due to atypical GERD, also he takes  minocycline and I wonder if that is part of the problem. We discussed empiric PPIs versus ENT referral, we agreed on PPIs, if not better he will let me know. Also recommend to stop minocycline and discuss the need of more abx w/ the MD who rx it. Patient thinks that the fumes of the truck are the culprit of symptoms, I cannot say/confirm that statement  Today, I spent more than 25   min with the patient: >50% of the time counseling regards the nature of his symptoms, why I suspect GERD and why I can't make the statement that truck fumes are the culprit of his symptoms. Multiple questions answered.

## 2016-05-25 DIAGNOSIS — Z09 Encounter for follow-up examination after completed treatment for conditions other than malignant neoplasm: Secondary | ICD-10-CM | POA: Insufficient documentation

## 2016-05-25 NOTE — Assessment & Plan Note (Signed)
GERD? Several weeks history of throat burning, exam is (-), no allergy sx, no classic reflux. He is a nonsmoker. He thinks is related to "fumes from the truck at work". Statistically sx are likely due to atypical GERD, also he takes minocycline and I wonder if that is part of the problem. We discussed empiric PPIs versus ENT referral, we agreed on PPIs, if not better he will let me know. Also recommend to stop minocycline and discuss the need of more abx w/ the MD who rx it. Patient thinks that the fumes of the truck are the culprit of symptoms, I cannot say/confirm that statement

## 2018-03-15 ENCOUNTER — Encounter: Payer: Self-pay | Admitting: Internal Medicine

## 2018-03-15 ENCOUNTER — Ambulatory Visit (INDEPENDENT_AMBULATORY_CARE_PROVIDER_SITE_OTHER): Payer: Managed Care, Other (non HMO) | Admitting: Internal Medicine

## 2018-03-15 VITALS — BP 136/78 | HR 73 | Temp 97.7°F | Resp 16 | Ht 70.0 in | Wt 231.5 lb

## 2018-03-15 DIAGNOSIS — L732 Hidradenitis suppurativa: Secondary | ICD-10-CM

## 2018-03-15 DIAGNOSIS — Z23 Encounter for immunization: Secondary | ICD-10-CM | POA: Diagnosis not present

## 2018-03-15 MED ORDER — DOXYCYCLINE HYCLATE 100 MG PO TABS: 100.0000 mg | ORAL_TABLET | Freq: Two times a day (BID) | ORAL | 0 refills | Status: DC

## 2018-03-15 MED ORDER — CLINDAMYCIN PHOSPHATE 1 % EX LOTN: TOPICAL_LOTION | Freq: Two times a day (BID) | CUTANEOUS | 0 refills | Status: DC

## 2018-03-15 MED FILL — CLINDAMYCIN PHOSP 1% LOTION: 1 | 30 days supply | Qty: 60 | Fill #0

## 2018-03-15 MED FILL — DOXYCYCLINE HYCLATE 100 MG: 100 | 7 days supply | Qty: 15 | Fill #0

## 2018-03-15 NOTE — Patient Instructions (Addendum)
   GO TO THE FRONT DESK Schedule your next appointment for a  Physical at your convenience  Your condition is called: Hydradenitis suppurativa   We are referring you to a new dermatologist  Take the antibiotics for 1 week  Apply the cream twice a day for a week

## 2018-03-15 NOTE — Progress Notes (Signed)
Pre visit review using our clinic review tool, if applicable. No additional management support is needed unless otherwise documented below in the visit note. 

## 2018-03-15 NOTE — Progress Notes (Signed)
Subjective:    Patient ID: Travis Solomon, male    DOB: [birth year], 62 y.o.   MRN: [id number]  DOS:  03/15/2018 Type of visit - description : Acute Interval history: The main concern of the patient today is "chronic staph infection" @ the armpits. Has seen a dermatologist, has been prescribed creams, antibiotics, local injections however he is not getting better. Area typically looks irritated,  he has chronic discharge, today is only moderately exacerbated. He also has problems with the knee DJD.  Review of Systems  Denies fever chills Past Medical History:  Diagnosis Date  . DJD (degenerative joint disease) 11/24/2013    Past Surgical History:  Procedure Laterality Date  . KNEE ARTHROSCOPY  2012    Social History   Socioeconomic History  . Marital status: Married    Spouse name: Not on file  . Number of children: 3  . Years of education: Not on file  . Highest education level: Not on file  Occupational History  . Occupation: truck Clinical research [employer]     Employer: [employer]  Social Needs  . Financial resource strain: Not on file  . Food insecurity:    Worry: Not on file    Inability: Not on file  . Transportation needs:    Medical: Not on file    Non-medical: Not on file  Tobacco Use  . Smoking status: Never Smoker  . Smokeless tobacco: Never Used  Substance and Sexual Activity  . Alcohol use: No  . Drug use: No  . Sexual activity: Not on file  Lifestyle  . Physical activity:    Days per week: Not on file    Minutes per session: Not on file  . Stress: Not on file  Relationships  . Social connections:    Talks on phone: Not on file    Gets together: Not on file    Attends religious service: Not on file    Active member of club or organization: Not on file    Attends meetings of clubs or organizations: Not on file    Relationship status: Not on file  . Intimate partner violence:    Fear of current or ex partner: Not on file    Emotionally abused: Not on  file    Physically abused: Not on file    Forced sexual activity: Not on file  Other Topics Concern  . Not on file  Social History Narrative  . Not on file      Allergies as of 03/15/2018   No Known Allergies     Medication List        Accurate as of 03/15/18 11:59 PM. Always use your most recent med list.          clindamycin 1 % lotion Commonly known as:  CLEOCIN T Apply topically 2 (two) times daily.   doxycycline 100 MG tablet Commonly known as:  VIBRA-TABS Take 1 tablet (100 mg total) by mouth 2 (two) times daily.          Objective:   Physical Exam BP 136/78 (BP Location: Left Arm, Patient Position: Sitting, Cuff Size: Normal)   Pulse 73   Temp 97.7 F (36.5 C) (Oral)   Resp 16   Ht 5\' 10"  (1.778 m)   Wt 231 lb 8 oz (105 kg)   SpO2 98%   BMI 33.22 kg/m  General:   Well developed, NAD, see BMI.  HEENT:  Normocephalic . Face symmetric, atraumatic Skin: See pictures of the  armpits.  Consistent with hidradenitis suppurativa Neurologic:  alert & oriented X3.  Speech normal, gait appropriate for age and unassisted Psych--  Cognition and judgment appear intact.  Cooperative with normal attention span and concentration.  Behavior appropriate. No anxious or depressed appearing.          Assessment & Plan:   Assessment DJD 2015: Chest pain, stress test negative  Plan: Hydradenitis suppurativa: Explaining the patient the diagnosis, he was not aware.  Explained that this is a chronic condition and difficult to treat. Today he looks moderately inflammation according to the patient. We will provide a round of doxycycline, topical clindamycin.  Refer to dermatology at [facility]. Preventive care: Declined a flu shot Tdap today. RTC for a physical exam at his convenience.  Today, I spent more than 25   min with the patient: >50% of the time counseling regards the dx of hydradenitis, he was not aware, discuss etiology, prognosis, multiple  questions asked, printed info material from UTD

## 2018-03-17 NOTE — Assessment & Plan Note (Signed)
Hydradenitis suppurativa: Explaining the patient the diagnosis, he was not aware.  Explained that this is a chronic condition and difficult to treat. Today he looks moderately inflammation according to the patient. We will provide a round of doxycycline, topical clindamycin.  [facility]. Preventive care: Declined a flu shot Tdap today. RTC for a physical exam at his convenience.

## 2018-04-15 DIAGNOSIS — L732 Hidradenitis suppurativa: Secondary | ICD-10-CM | POA: Insufficient documentation

## 2019-01-28 ENCOUNTER — Encounter: Payer: Self-pay | Admitting: Internal Medicine

## 2019-06-23 ENCOUNTER — Ambulatory Visit: Payer: Self-pay | Admitting: Internal Medicine

## 2019-06-23 ENCOUNTER — Other Ambulatory Visit: Payer: Self-pay

## 2019-06-23 NOTE — Telephone Encounter (Signed)
Patient scheduled for OV tomorrow with pcp.

## 2019-06-23 NOTE — Telephone Encounter (Signed)
Pt reports calf pain, bilaterally, onset "Past summer." States occurs mostly at night, "Shooting"  initially of left calf, now right. Reports "When I stand up, moves to front of thighs." States walking and stretching helps relive pain. Also reports when occurs fingers get numb. Denies any other symptoms, no new medications, does not recall any injury. Call transferred to practice for consideration of appt. Care advise given, pt verbalizes understanding.  Reason for Disposition . [1] MODERATE pain (e.g., interferes with normal activities, limping) AND [2] present > 3 days  Answer Assessment - Initial Assessment Questions 1. ONSET: "When did the pain start?"     Summer 2. LOCATION: "Where is the pain located?"      *No Answer* 3. PAIN: "How bad is the pain?"    (Scale 1-10; or mild, moderate, severe)   -  MILD (1-3): doesn't interfere with normal activities    -  MODERATE (4-7): interferes with normal activities (e.g., work or school) or awakens from sleep, limping    -  SEVERE (8-10): excruciating pain, unable to do any normal activities, unable to walk    Intermittent, shooting 4. WORK OR EXERCISE: "Has there been any recent work or exercise that involved this part of the body?"      Worse at night, walking helps relieve it 5. CAUSE: "What do you think is causing the leg pain?"     unsure 6. OTHER SYMPTOMS: "Do you have any other symptoms?" (e.g., chest pain, back pain, breathing difficulty, swelling, rash, fever, numbness, weakness)     none  Protocols used: LEG PAIN-A-AH

## 2019-06-24 ENCOUNTER — Ambulatory Visit (INDEPENDENT_AMBULATORY_CARE_PROVIDER_SITE_OTHER): Payer: Managed Care, Other (non HMO) | Admitting: Internal Medicine

## 2019-06-24 ENCOUNTER — Ambulatory Visit (HOSPITAL_BASED_OUTPATIENT_CLINIC_OR_DEPARTMENT_OTHER)
Admission: RE | Admit: 2019-06-24 | Discharge: 2019-06-24 | Disposition: A | Discharge status: Home / Self Care | Payer: Managed Care, Other (non HMO) | Source: Ambulatory Visit | Attending: Internal Medicine | Admitting: Internal Medicine

## 2019-06-24 ENCOUNTER — Other Ambulatory Visit: Payer: Self-pay

## 2019-06-24 VITALS — BP 124/73 | HR 84 | Temp 97.7°F | Resp 12 | Ht 70.0 in | Wt 234.0 lb

## 2019-06-24 DIAGNOSIS — R202 Paresthesia of skin: Secondary | ICD-10-CM

## 2019-06-24 DIAGNOSIS — R252 Cramp and spasm: Secondary | ICD-10-CM | POA: Diagnosis not present

## 2019-06-24 NOTE — Patient Instructions (Signed)
GO TO THE LAB : Get the blood work     GO TO THE FRONT DESK Schedule your next appointment   for a physical exam in 2 to 3 months    STOP BY THE FIRST FLOOR:  get the XR

## 2019-06-24 NOTE — Progress Notes (Signed)
Subjective:    Patient ID: Travis Solomon, male    DOB: January 01, 1956, 64 y.o.   MRN: [id number]  DOS:  06/24/2019 Type of visit - description: Acute He has few concerns: 49-month history of left calf cramps, mostly at night or when he lays down, they last for a while, decreased with  stretching and Advil. Sometimes the cramping is associated with pain at the lateral left thigh.   For the last 3 months has hand numbness, all the fingertips are numb in both hands, worse at night.  He specifically denies back pain, neck pain.  Review of Systems No fever chills or weight loss No chest pain no difficulty breathing No bladder or bowel incontinence No nausea, vomiting, diarrhea or blood in the stools  Past Medical History:  Diagnosis Date  . DJD (degenerative joint disease) 11/24/2013  . Hidradenitis suppurativa     Past Surgical History:  Procedure Laterality Date  . KNEE ARTHROSCOPY  2012    Social History   Socioeconomic History  . Marital status: Married    Spouse name: Not on file  . Number of children: 3  . Years of education: Not on file  . Highest education level: Not on file  Occupational History  . Occupation: truck Consulting civil engineer: [place]  Tobacco Use  . Smoking status: Never Smoker  . Smokeless tobacco: Never Used  Substance and Sexual Activity  . Alcohol use: No  . Drug use: No  . Sexual activity: Not on file  Other Topics Concern  . Not on file  Social History Narrative  . Not on file   Social Determinants of Health   Financial Resource Strain:   . Difficulty of Paying Living Expenses: Not on file  Food Insecurity:   . Worried About Charity fundraiser in the Last Year: Not on file  . Ran Out of Food in the Last Year: Not on file  Transportation Needs:   . Lack of Transportation (Medical): Not on file  . Lack of Transportation (Non-Medical): Not on file  Physical Activity:   . Days of Exercise per Week: Not on file  . Minutes of  Exercise per Session: Not on file  Stress:   . Feeling of Stress : Not on file  Social Connections:   . Frequency of Communication with Friends and Family: Not on file  . Frequency of Social Gatherings with Friends and Family: Not on file  . Attends Religious Services: Not on file  . Active Member of Clubs or Organizations: Not on file  . Attends Archivist Meetings: Not on file  . Marital Status: Not on file  Intimate Partner Violence:   . Fear of Current or Ex-Partner: Not on file  . Emotionally Abused: Not on file  . Physically Abused: Not on file  . Sexually Abused: Not on file      Allergies as of 06/24/2019   No Known Allergies     Medication List       Accurate as of June 24, 2019  2:10 PM. If you have any questions, ask your nurse or doctor.        STOP taking these medications   clindamycin 1 % lotion Commonly known as: CLEOCIN T Stopped by: Kathlene November, MD   doxycycline 100 MG tablet Commonly known as: VIBRA-TABS Stopped by: Kathlene November, MD           Objective:   Physical Exam BP 124/73 (BP  Location: Right Arm, Cuff Size: Large)   Pulse 84   Temp 97.7 F (36.5 C) (Temporal)   Resp 12   Ht 5\' 10"  (1.778 m)   Wt 234 lb (106.1 kg)   SpO2 100%   BMI 33.58 kg/m  General:   Well developed, NAD, BMI noted.  HEENT:  Normocephalic . Face symmetric, atraumatic. Neck: No thyromegaly Lungs:  CTA B Normal respiratory effort, no intercostal retractions, no accessory muscle use. Heart: RRR,  no murmur.  no pretibial edema bilaterally  Abdomen:  Not distended, soft, non-tender. No rebound or rigidity.   Skin: Not pale. Not jaundice Neurologic:  alert & oriented X3.  Speech normal, gait appropriate for age and unassisted Motor symmetric. DTRs: Symmetric except for slightly decreased left knee jerk Distal pinprick examination of the lower extremities normal Psych--  Cognition and judgment appear intact.  Cooperative with normal attention  span and concentration.  Behavior appropriate. No anxious or depressed appearing.     Assessment     Assessment DJD 2015: Chest pain, stress test negative Hidradenitis suppurativa  PLAN Left leg pain, left calf cramps, bilateral hand paresthesias: Etiology  not completely clear, this could be spine DJD or a metabolic issue. Plan: CMP, CBC, A1c, TSH, B12, folic acid, RPR. Plain x-rays of the cervical spine and lumbar spine We talk about possibly referring him to neurology ([facility]?) but he is reluctant to proceed, thus  will wait for results. Preventive care: Declined a flu shot Strongly recommend him to schedule a physical exam, I typically see him  every several months with acute problems   This visit occurred during the SARS-CoV-2 public health emergency.  Safety protocols were in place, including screening questions prior to the visit, additional usage of staff PPE, and extensive cleaning of exam room while observing appropriate contact time as indicated for disinfecting solutions.

## 2019-06-25 LAB — RPR: RPR Ser Ql: NONREACTIVE

## 2019-06-25 LAB — COMPREHENSIVE METABOLIC PANEL
ALT: 21 U/L (ref 0–53)
AST: 22 U/L (ref 0–37)
Albumin: 4.4 g/dL (ref 3.5–5.2)
Alkaline Phosphatase: 58 U/L (ref 39–117)
BUN: 16 mg/dL (ref 6–23)
CO2: 31 mEq/L (ref 19–32)
Calcium: 9.6 mg/dL (ref 8.4–10.5)
Chloride: 100 mEq/L (ref 96–112)
Creatinine, Ser: 0.97 mg/dL (ref 0.40–1.50)
GFR: 94.86 mL/min (ref >60.00)
Glucose, Bld: 94 mg/dL (ref 70–99)
Potassium: 4.7 mEq/L (ref 3.5–5.1)
Sodium: 138 mEq/L (ref 135–145)
Total Bilirubin: 0.4 mg/dL (ref 0.2–1.2)
Total Protein: 7.5 g/dL (ref 6.0–8.3)

## 2019-06-25 LAB — CBC
HCT: 43.5 % (ref 39.0–52.0)
Hemoglobin: 13.7 g/dL (ref 13.0–17.0)
MCHC: 31.4 g/dL (ref 30.0–36.0)
MCV: 72.1 fl — ABNORMAL LOW (ref 78.0–100.0)
Platelets: 329 10*3/uL (ref 150.0–400.0)
RBC: 6.03 Mil/uL — ABNORMAL HIGH (ref 4.22–5.81)
RDW: 16 % — ABNORMAL HIGH (ref 11.5–15.5)
WBC: 9.6 10*3/uL (ref 4.0–10.5)

## 2019-06-25 LAB — FOLATE: Folate: 10.8 ng/mL (ref >5.9)

## 2019-06-25 LAB — TSH: TSH: 1.12 u[IU]/mL (ref 0.35–4.50)

## 2019-06-25 LAB — VITAMIN B12: Vitamin B-12: 451 pg/mL (ref 211–911)

## 2019-06-25 LAB — HEMOGLOBIN A1C: Hgb A1c MFr Bld: 6 % (ref 4.6–6.5)

## 2019-06-25 NOTE — Assessment & Plan Note (Signed)
Left leg pain, left calf cramps, bilateral hand paresthesias: Etiology  not completely clear, this could be spine DJD or a metabolic issue. Plan: CMP, CBC, I7T, TSH, I45, folic acid, RPR. Plain x-rays of the cervical spine and lumbar spine We talk about possibly referring him to neurology ([facility]?) but he is reluctant to proceed, thus  will wait for results. Preventive care: Declined a flu shot Strongly recommend him to schedule a physical exam, I typically see him  every several months with acute problems

## 2019-07-01 ENCOUNTER — Encounter: Payer: Self-pay | Admitting: *Deleted

## 2019-09-24 ENCOUNTER — Ambulatory Visit: Payer: Managed Care, Other (non HMO) | Admitting: Internal Medicine

## 2019-10-04 ENCOUNTER — Ambulatory Visit: Payer: Managed Care, Other (non HMO) | Attending: Internal Medicine

## 2019-10-04 DIAGNOSIS — Z23 Encounter for immunization: Secondary | ICD-10-CM

## 2019-10-04 NOTE — Progress Notes (Signed)
   Covid-19 Vaccination Clinic  Name:  Travis Solomon    MRN: [id number] DOB: [birth year]  10/04/2019  [name] was observed post Covid-19 immunization for 15 minutes without incident. He was provided with Vaccine Information Sheet and instruction to access the V-Safe system.   [name] was instructed to call 911 with any severe reactions post vaccine: Marland Kitchen Difficulty breathing  . Swelling of face and throat  . A fast heartbeat  . A bad rash all over body  . Dizziness and weakness   Immunizations Administered    Name Date Dose VIS Date Route   Pfizer COVID-19 Vaccine 10/04/2019  9:14 AM 0.3 mL 06/06/2019 Intramuscular   Manufacturer: [employer], [employer]   Lot: [id number]   NDC: [address]-6

## 2019-10-27 ENCOUNTER — Ambulatory Visit: Payer: Managed Care, Other (non HMO) | Attending: Internal Medicine

## 2019-10-27 DIAGNOSIS — Z23 Encounter for immunization: Secondary | ICD-10-CM

## 2019-10-27 NOTE — Progress Notes (Signed)
   Covid-19 Vaccination Clinic  Name:  DOCK BACCAM    MRN: [id number] DOB: 1956-02-07  10/27/2019  Mr. Renfrew was observed post Covid-19 immunization for 15 minutes without incident. He was provided with Vaccine Information Sheet and instruction to access the V-Safe system.   Mr. Crutchfield was instructed to call 911 with any severe reactions post vaccine: Marland Kitchen Difficulty breathing  . Swelling of face and throat  . A fast heartbeat  . A bad rash all over body  . Dizziness and weakness   Immunizations Administered    Name Date Dose VIS Date Route   Pfizer COVID-19 Vaccine 10/27/2019 12:45 PM 0.3 mL 08/20/2018 Intramuscular   Manufacturer: [employer], [employer]   Lot: [id number]   NDC: [address]-9

## 2020-04-05 LAB — HM HIV SCREENING LAB: HM HIV Screening: NEGATIVE

## 2020-04-05 LAB — HM HEPATITIS C SCREENING LAB: HM Hepatitis Screen: NEGATIVE

## 2020-09-06 ENCOUNTER — Encounter: Payer: Self-pay | Admitting: Internal Medicine

## 2020-09-30 ENCOUNTER — Encounter: Payer: Self-pay | Admitting: Internal Medicine

## 2021-07-20 IMAGING — DX DG CERVICAL SPINE COMPLETE 4+V
7 series · 7 of 7 positions shown · non-contrast
Comparison: None.

CLINICAL DATA: Paresthesias numbness in the hand

EXAM:
CERVICAL SPINE - COMPLETE 4+ VIEW

[c-spine lat]
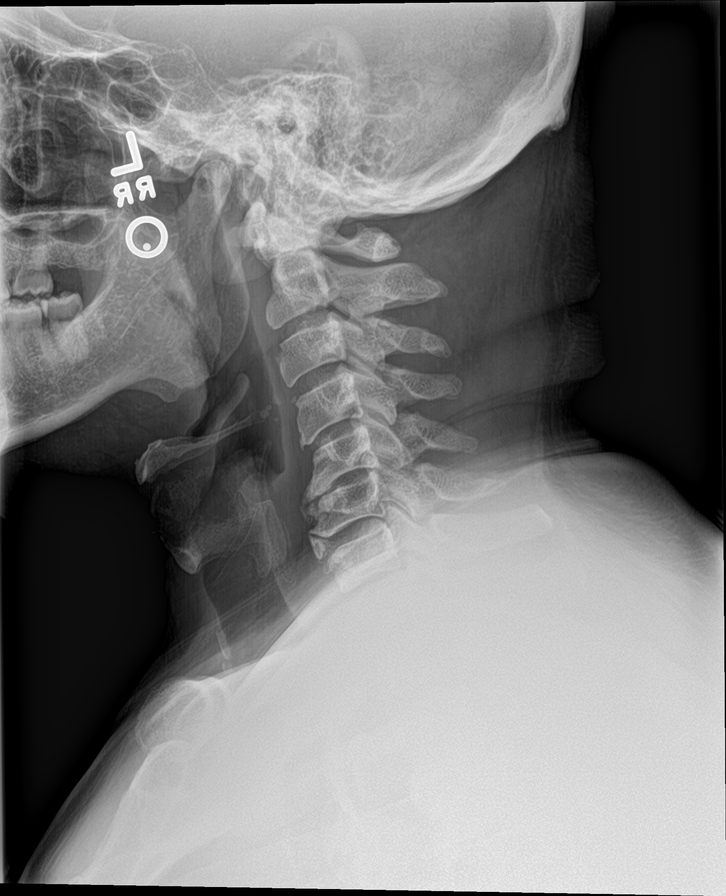

[c-spine obl (1 of 2)]
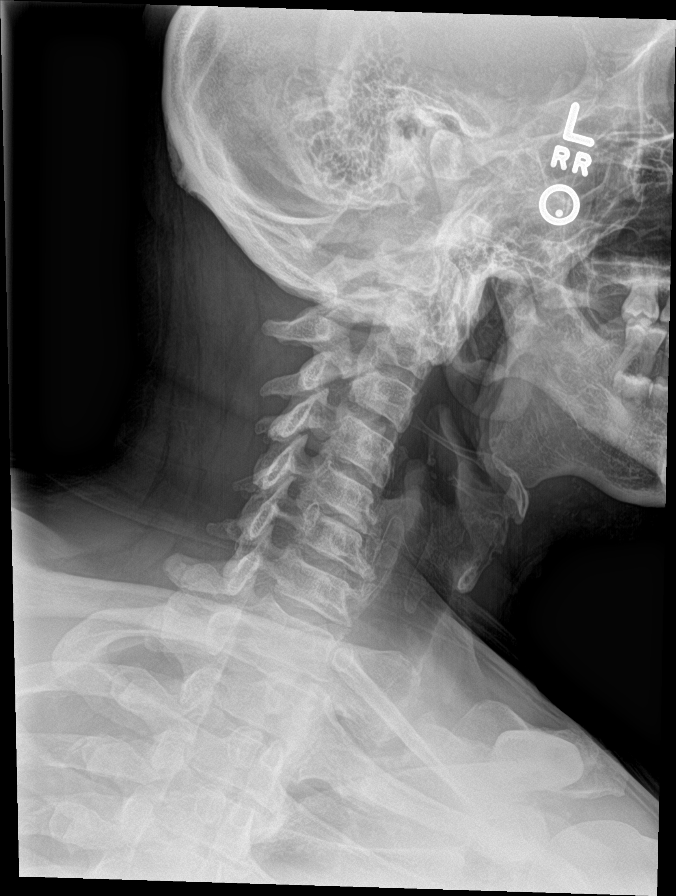

[c-spine obl (2 of 2)]
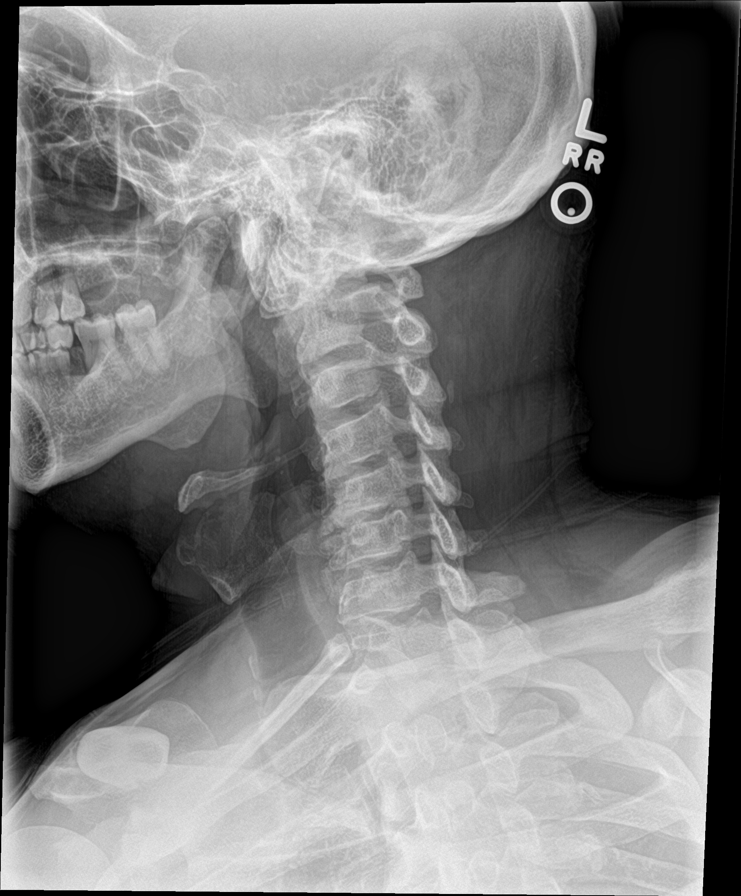

[c-spine ap]
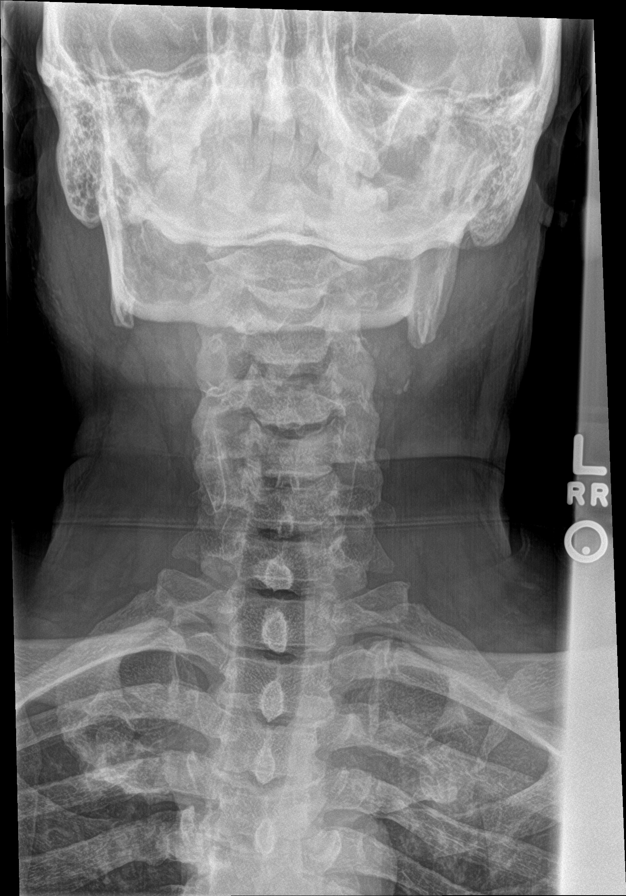

[c-spine open mouth]
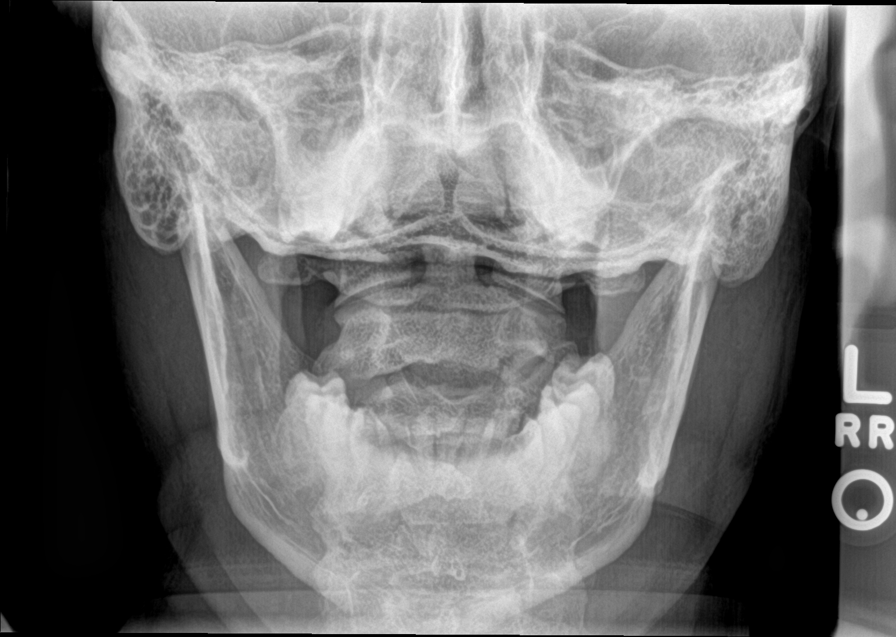

[c-spine swimmers]
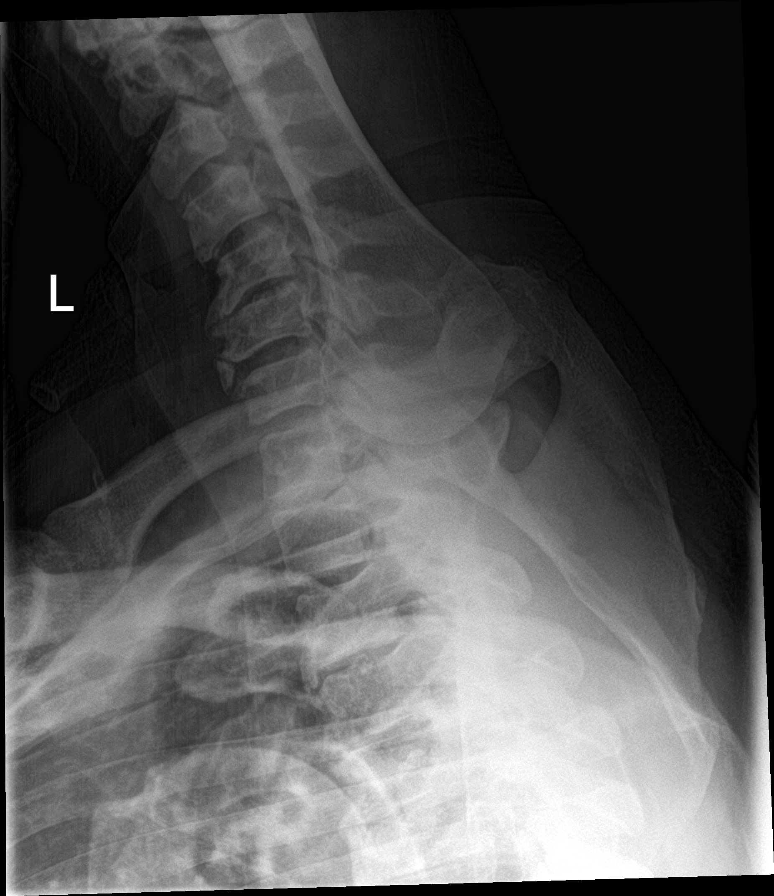

[[person_name]]
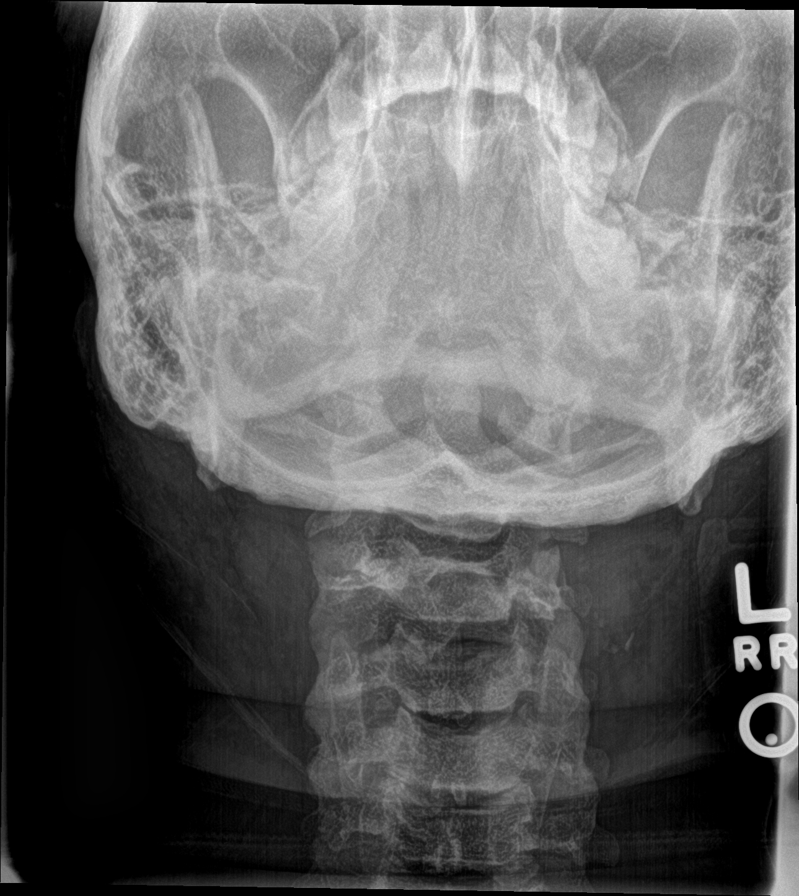

[7 of 7 positions shown; findings below may reference images not displayed]

FINDINGS: Straightening of the cervical spine. Vertebral body heights are
maintained. Dens and lateral masses are within normal limits.
Moderate degenerative change C5-C6 with mild degenerative change
C4-C5 and C6-C7. Bilateral foraminal narrowing at C5-C6.
IMPRESSION: No acute osseous abnormality. Degenerative changes most evident at
C5-C6

## 2021-08-26 ENCOUNTER — Encounter: Payer: Self-pay | Admitting: Internal Medicine

## 2021-12-26 ENCOUNTER — Ambulatory Visit (INDEPENDENT_AMBULATORY_CARE_PROVIDER_SITE_OTHER): Payer: Managed Care, Other (non HMO) | Admitting: Internal Medicine

## 2021-12-26 ENCOUNTER — Encounter: Payer: Self-pay | Admitting: Internal Medicine

## 2021-12-26 VITALS — BP 134/70 | HR 91 | Temp 98.0°F | Resp 16 | Ht 70.0 in | Wt 234.2 lb

## 2021-12-26 DIAGNOSIS — R739 Hyperglycemia, unspecified: Secondary | ICD-10-CM | POA: Diagnosis not present

## 2021-12-26 DIAGNOSIS — Z01818 Encounter for other preprocedural examination: Secondary | ICD-10-CM | POA: Diagnosis not present

## 2021-12-26 DIAGNOSIS — M170 Bilateral primary osteoarthritis of knee: Secondary | ICD-10-CM

## 2021-12-26 LAB — CBC WITH DIFFERENTIAL/PLATELET
Basophils Absolute: 43 cells/uL (ref 0–200)
Lymphs Abs: 3724 cells/uL (ref 850–3900)
MCH: 22.9 pg — ABNORMAL LOW (ref 27.0–33.0)
MPV: 9 fL (ref 7.5–12.5)
Neutrophils Relative %: 45.5 %
Platelets: 281 10*3/uL (ref 140–400)
WBC: 8.6 10*3/uL (ref 3.8–10.8)

## 2021-12-26 NOTE — Patient Instructions (Addendum)
Recommend to proceed with covid booster (bivalent) at your pharmacy.      GO TO THE LAB : Get the blood work     GO TO THE FRONT DESK, PLEASE SCHEDULE YOUR APPOINTMENTS Come back for a physical exam in 3 months

## 2021-12-26 NOTE — Progress Notes (Unsigned)
   Subjective:    Patient ID: Travis Solomon, male    DOB: Sep 29, 1955, 66 y.o.   MRN: [id number]  DOS:  12/26/2021 Type of visit - description: Surgical clearance   Since the last office visit here in 2021 he is doing well except for knee pain.  He has been seen by Ortho and recommended bilateral TKR.  He denies chest pain or difficulty breathing. Takes ibuprofen frequently without any GI symptoms. Denies lower extremity pretibial edema.  His knees do swell up at times. He is able to go upstairs without any problems except for knee pain. No orthopnea.  Wt Readings from Last 3 Encounters:  12/26/21 234 lb 4 oz (106.3 kg)  06/24/19 234 lb (106.1 kg)  03/15/18 231 lb 8 oz (105 kg)     Review of Systems See above   Past Medical History:  Diagnosis Date   DJD (degenerative joint disease) 11/24/2013   Hidradenitis suppurativa     Past Surgical History:  Procedure Laterality Date   KNEE ARTHROSCOPY  2012    Current Outpatient Medications  Medication Instructions   Adalimumab (HUMIRA PEN) 40 MG/0.4ML PNKT No dose, route, or frequency recorded.   ibuprofen (ADVIL) 200 mg, Oral, Every 6 hours PRN       Objective:   Physical Exam BP 134/70   Pulse 91   Temp 98 F (36.7 C) (Oral)   Resp 16   Ht 5\' 10"  (1.778 m)   Wt 234 lb 4 oz (106.3 kg)   SpO2 97%   BMI 33.61 kg/m  General:   Well developed, NAD, BMI noted.  HEENT:  Normocephalic . Face symmetric, atraumatic Neck: No JVD at 45 degrees Lungs:  CTA B Normal respiratory effort, no intercostal retractions, no accessory muscle use. Heart: RRR,  no murmur.  Abdomen:  Not distended, soft, non-tender. No rebound or rigidity.   Skin: Not pale. Not jaundice Lower extremities: no pretibial edema bilaterally.  Significant bowing noted. Neurologic:  alert & oriented X3.  Speech normal, gait and transferring: Limited by DJD  psych--  Cognition and judgment appear intact.  Cooperative with normal attention span and  concentration.  Behavior appropriate. No anxious or depressed appearing.     Assessment     ASSESSMENT DJD 2015: Chest pain, ETT  test negative  Hidradenitis suppurativa (follow-up by Derm, Humira)   PLAN DJD: Bilateral knee DJD a need for surgery, patient reports they will do further right knee than the left. On chronic ibuprofen without apparent GI side effects. No FH CAD, not a smoker.  No cardiopulmonary symptoms. EKG today: NSR and similar to previous EKG. Last Tdap 2019.   Plan: - CMP, CBC.  He is cleared for surgery. Recommend to clear with dermatology, perhaps they could stop Humira temporarily. Hyperglycemia, per chart review, check A1c. RTC 3 months CPX

## 2021-12-27 LAB — CBC WITH DIFFERENTIAL/PLATELET
Absolute Monocytes: 662 cells/uL (ref 200–950)
Basophils Relative: 0.5 %
Eosinophils Absolute: 258 cells/uL (ref 15–500)
Eosinophils Relative: 3 %
HCT: 47.1 % (ref 38.5–50.0)
Hemoglobin: 14.7 g/dL (ref 13.2–17.1)
MCHC: 31.2 g/dL — ABNORMAL LOW (ref 32.0–36.0)
MCV: 73.5 fL — ABNORMAL LOW (ref 80.0–100.0)
Monocytes Relative: 7.7 %
Neutro Abs: 3913 cells/uL (ref 1500–7800)
RBC: 6.41 10*6/uL — ABNORMAL HIGH (ref 4.20–5.80)
RDW: 15.8 % — ABNORMAL HIGH (ref 11.0–15.0)
Total Lymphocyte: 43.3 %

## 2021-12-27 LAB — COMPREHENSIVE METABOLIC PANEL
AG Ratio: 1.6 (calc) (ref 1.0–2.5)
ALT: 13 U/L (ref 9–46)
AST: 14 U/L (ref 10–35)
Albumin: 4.4 g/dL (ref 3.6–5.1)
Alkaline phosphatase (APISO): 60 U/L (ref 35–144)
BUN: 15 mg/dL (ref 7–25)
CO2: 30 mmol/L (ref 20–32)
Calcium: 9.6 mg/dL (ref 8.6–10.3)
Chloride: 107 mmol/L (ref 98–110)
Creat: 1.11 mg/dL (ref 0.70–1.35)
Globulin: 2.7 g/dL (calc) (ref 1.9–3.7)
Glucose, Bld: 116 mg/dL — ABNORMAL HIGH (ref 65–99)
Potassium: 4.8 mmol/L (ref 3.5–5.3)
Sodium: 142 mmol/L (ref 135–146)
Total Bilirubin: 0.5 mg/dL (ref 0.2–1.2)
Total Protein: 7.1 g/dL (ref 6.1–8.1)

## 2021-12-27 LAB — HEMOGLOBIN A1C
Hgb A1c MFr Bld: 6.1 % of total Hgb — ABNORMAL HIGH (ref <5.7)
Mean Plasma Glucose: 128 mg/dL
eAG (mmol/L): 7.1 mmol/L

## 2021-12-27 NOTE — Assessment & Plan Note (Signed)
DJD: Bilateral knee DJD a need for surgery, patient reports they will do first the R  knee than the L. On chronic ibuprofen without apparent GI side effects. No FH CAD, not a smoker.  No cardiopulmonary symptoms. EKG today: NSR and similar to previous EKG. Last Tdap 2019.   Plan: - CMP, CBC.  He is cleared for surgery. -Recommend to clear with dermatology, perhaps they could stop Humira temporarily. Hyperglycemia, per chart review, check A1c. RTC 3 months CPX

## 2021-12-29 ENCOUNTER — Telehealth: Payer: Self-pay

## 2021-12-29 NOTE — Telephone Encounter (Signed)
Received fax confirmation

## 2021-12-29 NOTE — Telephone Encounter (Signed)
Pt needing R TKA on 01/19/22 with Dr. Charlann Boxer. Pt seen on 12/26/21- cleared for surgery per Dr. Drue Novel- he does recommend ortho reach out to dermatology to see if Humira needs to be held. OV note, labs and EKG from 12/26/21 faxed to [facility] at [phone] and [facility] fax at [phone]. Form sent for scanning.

## 2022-01-03 ENCOUNTER — Telehealth: Payer: Self-pay

## 2022-01-03 NOTE — Telephone Encounter (Signed)
Received Surgical Clearance form from [facility]- Pt is scheduled for L TKA w/ Dr. Charlann Boxer on 03/23/22. Pt has been approved for his R TKA on 12/26/21 w/ recommendations to get clearance from dermatology to see if Humira needs to be held. Is Pt also approved/cleared for L TKA?

## 2022-01-04 NOTE — Telephone Encounter (Signed)
Per Dr. Drue Novel- Pt cleared for surgery. Ortho should contact dermatology to see if Humira needs to be held.

## 2022-01-04 NOTE — Telephone Encounter (Signed)
Form faxed back to [facility] at [phone]. Form sent for scanning.

## 2022-01-05 NOTE — Telephone Encounter (Signed)
Received fax confirmation

## 2022-02-19 HISTORY — PX: KNEE SURGERY: SHX244

## 2022-02-21 ENCOUNTER — Encounter: Payer: Self-pay | Admitting: Internal Medicine

## 2022-03-27 ENCOUNTER — Ambulatory Visit: Payer: BC Managed Care – PPO | Admitting: Internal Medicine

## 2022-03-27 ENCOUNTER — Encounter: Payer: Self-pay | Admitting: Internal Medicine

## 2022-03-27 VITALS — BP 126/80 | HR 94 | Temp 98.3°F | Resp 18 | Ht 70.0 in | Wt 240.1 lb

## 2022-03-27 DIAGNOSIS — Z0000 Encounter for general adult medical examination without abnormal findings: Secondary | ICD-10-CM | POA: Insufficient documentation

## 2022-03-27 DIAGNOSIS — R739 Hyperglycemia, unspecified: Secondary | ICD-10-CM | POA: Diagnosis not present

## 2022-03-27 NOTE — Patient Instructions (Addendum)
   GO TO THE LAB : Get the blood work     [place], [place] Come back for   a checkup in 6 months      Advanced care planning:  Do you have a "Living will", "[place] of attorney" ?   If you already have a living will or healthcare power of attorney, is recommended you bring the copy to be scanned in your chart. The document will be available to all the doctors you see in the system.  If you don't have one, please consider create one.  Advance care planning is a process that supports adults in  understanding and sharing their preferences regarding future medical care.   The patient's preferences are recorded in documents called Advance Directives.    Advanced directives are completed (and can be modified at any time) while the patient is in full mental capacity.   The documentation should be available at all times to the patient, the family and the healthcare providers.   This legal documents direct treatment decision making and/or appoint a surrogate to make the decision if the patient is not capable to do so.    Advance directives can be documented in many types of formats,  documents have names such as:  Lliving will  Durable power of attorney for healthcare (healthcare proxy or healthcare power of attorney)  Combined directives  Physician orders for life-sustaining treatment    More information at:  [email or web address]

## 2022-03-27 NOTE — Progress Notes (Signed)
   Subjective:    Patient ID: Travis Solomon, male    DOB: [birth year], 66 y.o.   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  Tobacco Use   Smoking status: Never   Smokeless tobacco: Never  Substance and Sexual Activity   Alcohol use: No   Drug use: No   Sexual activity: Not on file  Other Topics Concern   Not on file  Social History Narrative   3 children, 2 alive   Household: pt, wife, daughter    Social Determinants of Health   Financial Resource Strain: Not on file  Food Insecurity: Not on file  Transportation Needs: Not on file  Physical Activity: Not on file  Stress: Not on file  Social Connections: Not on file  Intimate Partner Violence: Not on file    Current Outpatient Medications  Medication Instructions   Adalimumab (HUMIRA PEN) 40 MG/0.4ML PNKT No dose, route, or frequency recorded.   ibuprofen (ADVIL) 200 mg, Oral, Every 6 hours PRN       Objective:   Physical Exam BP 126/80   Pulse 94   Temp 98.3 F (36.8 C) (Oral)   Resp 18   Ht 5\' 10"  (1.778 m)   Wt 240 lb 2 oz (108.9 kg)   SpO2 95%   BMI 34.45 kg/m  General: Well developed, NAD, BMI noted Neck: No  thyromegaly  HEENT:  Normocephalic . Face  symmetric, atraumatic Lungs:  CTA B Normal respiratory effort, no intercostal retractions, no accessory muscle use. Heart: RRR,  no murmur.  Abdomen:  Not distended, soft, non-tender. No rebound or rigidity.   Lower extremities: no pretibial edema bilaterally DRE: Normal sphincter tone, brown stools, prostate normal Skin: Exposed areas without rash. Not pale. Not jaundice Neurologic:  alert & oriented X3.  Speech normal, gait and transferring limited due to pain Strength symmetric and appropriate for age.  Psych: Cognition and judgment appear intact.  Cooperative with normal attention span and concentration.  Behavior appropriate. No anxious or depressed appearing.     Assessment    ASSESSMENT Hyperglycemia DJD 2015: Chest pain, ETT  test negative Hidradenitis suppurativa (follow-up by Derm, Humira)   PLAN Here for CPX Hyperglycemia: Last A1c 6.1, stable.  Encouraged him healthy diet with portion control and low carbohydrate intake. Hidradenitis suppurativa: Per dermatology, on Humira. RTC 6 months

## 2022-03-28 ENCOUNTER — Encounter: Payer: Self-pay | Admitting: Internal Medicine

## 2022-03-28 LAB — CBC WITH DIFFERENTIAL/PLATELET
Basophils Absolute: 0.1 10*3/uL (ref 0.0–0.1)
Basophils Relative: 0.9 % (ref 0.0–3.0)
Eosinophils Absolute: 0.3 10*3/uL (ref 0.0–0.7)
Eosinophils Relative: 4.1 % (ref 0.0–5.0)
HCT: 43.9 % (ref 39.0–52.0)
Hemoglobin: 13.5 g/dL (ref 13.0–17.0)
Lymphocytes Relative: 38.1 % (ref 12.0–46.0)
Lymphs Abs: 2.9 10*3/uL (ref 0.7–4.0)
MCHC: 30.8 g/dL (ref 30.0–36.0)
MCV: 72.9 fl — ABNORMAL LOW (ref 78.0–100.0)
Monocytes Absolute: 0.7 10*3/uL (ref 0.1–1.0)
Monocytes Relative: 8.9 % (ref 3.0–12.0)
Neutro Abs: 3.6 10*3/uL (ref 1.4–7.7)
Neutrophils Relative %: 48 % (ref 43.0–77.0)
Platelets: 286 10*3/uL (ref 150.0–400.0)
RBC: 6.02 Mil/uL — ABNORMAL HIGH (ref 4.22–5.81)
RDW: 15.7 % — ABNORMAL HIGH (ref 11.5–15.5)
WBC: 7.6 10*3/uL (ref 4.0–10.5)

## 2022-03-28 LAB — LIPID PANEL
Cholesterol: 226 mg/dL — ABNORMAL HIGH (ref 0–200)
HDL: 51.1 mg/dL (ref >39.00)
NonHDL: 175.08
Total CHOL/HDL Ratio: 4
Triglycerides: 274 mg/dL — ABNORMAL HIGH (ref 0.0–149.0)
VLDL: 54.8 mg/dL — ABNORMAL HIGH (ref 0.0–40.0)

## 2022-03-28 LAB — BASIC METABOLIC PANEL
BUN: 17 mg/dL (ref 6–23)
CO2: 28 mEq/L (ref 19–32)
Calcium: 10.4 mg/dL (ref 8.4–10.5)
Chloride: 102 mEq/L (ref 96–112)
Creatinine, Ser: 1.31 mg/dL (ref 0.40–1.50)
GFR: 57.38 mL/min — ABNORMAL LOW (ref >60.00)
Glucose, Bld: 99 mg/dL (ref 70–99)
Potassium: 4.4 mEq/L (ref 3.5–5.1)
Sodium: 139 mEq/L (ref 135–145)

## 2022-03-28 LAB — TSH: TSH: 1.05 u[IU]/mL (ref 0.35–5.50)

## 2022-03-28 LAB — PSA: PSA: 2.66 ng/mL (ref 0.10–4.00)

## 2022-03-28 LAB — LDL CHOLESTEROL, DIRECT: Direct LDL: 150 mg/dL

## 2022-03-28 NOTE — Assessment & Plan Note (Signed)
Here for CPX Hyperglycemia: Last A1c 6.1, stable.  Encouraged him healthy diet with portion control and low carbohydrate intake. Hidradenitis suppurativa: Per dermatology, on Humira. RTC 6 months

## 2022-03-28 NOTE — Assessment & Plan Note (Signed)
Here for CPX Hyperglycemia: Last A1c 6.1, stable.  Encouraged him healthy diet with portion control and low carbohydrate intake. Hidradenitis suppurativa: Per dermatology, on Humira. RTC 6 months 

## 2022-03-29 ENCOUNTER — Ambulatory Visit: Payer: Managed Care, Other (non HMO) | Admitting: Internal Medicine

## 2022-05-01 HISTORY — PX: KNEE ARTHROSCOPY: SUR90

## 2022-08-07 ENCOUNTER — Ambulatory Visit (INDEPENDENT_AMBULATORY_CARE_PROVIDER_SITE_OTHER): Payer: Medicare Other | Admitting: Internal Medicine

## 2022-08-07 ENCOUNTER — Encounter: Payer: Self-pay | Admitting: Internal Medicine

## 2022-08-07 VITALS — BP 126/82 | HR 100 | Temp 98.0°F | Resp 18 | Ht 70.0 in | Wt 248.1 lb

## 2022-08-07 DIAGNOSIS — H9313 Tinnitus, bilateral: Secondary | ICD-10-CM | POA: Diagnosis not present

## 2022-08-07 DIAGNOSIS — H9193 Unspecified hearing loss, bilateral: Secondary | ICD-10-CM | POA: Diagnosis not present

## 2022-08-07 DIAGNOSIS — G5620 Lesion of ulnar nerve, unspecified upper limb: Secondary | ICD-10-CM | POA: Diagnosis not present

## 2022-08-07 NOTE — Patient Instructions (Addendum)
GO TO THE [facility] WEBS[facility]: cubital tunnel syndrome  See you in April

## 2022-08-07 NOTE — Progress Notes (Signed)
   Subjective:    Patient ID: Travis Solomon, male    DOB: Aug 13, 1955, 67 y.o.   MRN: [id number]  DOS:  08/07/2022 Type of visit - description: Acute  Has a long history of tinnitus and decreased hearing, is gradually getting worse. In the [employer] he was exposed to loud noises for many years.  Also, has a history of numbness in both hands, mostly at the cubital side. Symptoms are worse at night and worse when he is sitting down with his arms resting over the sofa No major neck pain.  No elbow pain.   Review of Systems See above   Past Medical History:  Diagnosis Date   DJD (degenerative joint disease) 11/24/2013   Hidradenitis suppurativa     Past Surgical History:  Procedure Laterality Date   KNEE ARTHROSCOPY  06/26/2010   KNEE ARTHROSCOPY Left 05/01/2022   KNEE SURGERY Right 02/19/2022   replacement    Current Outpatient Medications  Medication Instructions   Adalimumab (HUMIRA PEN) 40 MG/0.4ML PNKT No dose, route, or frequency recorded.   ibuprofen (ADVIL) 200 mg, Oral, Every 6 hours PRN       Objective:   Physical Exam BP 126/82   Pulse 100   Temp 98 F (36.7 C) (Oral)   Resp 18   Ht 5\' 10"  (1.778 m)   Wt 248 lb 2 oz (112.5 kg)   SpO2 98%   BMI 35.60 kg/m  General:   Well developed, NAD, BMI noted. HEENT:  Normocephalic . Face symmetric, atraumatic Upper extremities Wrists without synovitis Hands without atrophy Grip is satisfactory Elbows no TTP and range of motion is good Lower extremities: no pretibial edema bilaterally  Skin: Not pale. Not jaundice Neurologic:  alert & oriented X3.  Speech normal, gait appropriate for age and unassisted.  DTR symmetric Psych--  Cognition and judgment appear intact.  Cooperative with normal attention span and concentration.  Behavior appropriate. No anxious or depressed appearing.      Assessment   ASSESSMENT Hyperglycemia DJD 2015: Chest pain, ETT  test negative Hidradenitis suppurativa (follow-up by  Candiss Norse)   PLAN HOH, tinnitus: Chronic and getting worse, documentation for the [place] signed, I do believe this is likely related to noise exposure while he was in the [employer]. Referred to an audiologist. 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 If symptoms increase, persisting, severe: let me know. Encouraged to research the issue.  See AVS. RTC scheduled for April

## 2022-08-07 NOTE — Assessment & Plan Note (Signed)
HOH, tinnitus: Chronic and getting worse, documentation for the [place] signed, I do believe this is likely related to noise exposure while he was in the [employer]. Referred to an audiologist. 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 If symptoms increase, persisting, severe: let me know. Encouraged to research the issue.  See AVS. RTC scheduled for April

## 2022-09-19 DIAGNOSIS — L732 Hidradenitis suppurativa: Secondary | ICD-10-CM | POA: Diagnosis not present

## 2022-09-26 ENCOUNTER — Ambulatory Visit: Payer: Medicare (Managed Care) | Admitting: Internal Medicine

## 2022-09-26 ENCOUNTER — Encounter: Payer: Self-pay | Admitting: Internal Medicine

## 2022-09-26 VITALS — BP 132/80 | HR 86 | Temp 98.1°F | Resp 16 | Ht 70.0 in | Wt 251.2 lb

## 2022-09-26 DIAGNOSIS — E785 Hyperlipidemia, unspecified: Secondary | ICD-10-CM | POA: Diagnosis not present

## 2022-09-26 DIAGNOSIS — J3089 Other allergic rhinitis: Secondary | ICD-10-CM

## 2022-09-26 DIAGNOSIS — R739 Hyperglycemia, unspecified: Secondary | ICD-10-CM

## 2022-09-26 LAB — LDL CHOLESTEROL, DIRECT: Direct LDL: 136 mg/dL

## 2022-09-26 LAB — LIPID PANEL
Cholesterol: 206 mg/dL — ABNORMAL HIGH (ref 0–200)
HDL: 45.1 mg/dL (ref >39.00)
NonHDL: 160.95
Total CHOL/HDL Ratio: 5
Triglycerides: 221 mg/dL — ABNORMAL HIGH (ref 0.0–149.0)
VLDL: 44.2 mg/dL — ABNORMAL HIGH (ref 0.0–40.0)

## 2022-09-26 LAB — HEMOGLOBIN A1C: Hgb A1c MFr Bld: 6.6 % — ABNORMAL HIGH (ref 4.6–6.5)

## 2022-09-26 NOTE — Progress Notes (Unsigned)
   Subjective:    Patient ID: Travis Solomon, male    DOB: 1956-02-25, 67 y.o.   MRN: [id number]  DOS:  09/26/2022 Type of visit - description: f/u  Routine follow-up. Still having problems with knee pain. Tinnitus, HOH: Will see ENT soon.  Also reporting 2 months history of dry and itchy throat on and off.  Admits to sneezing, itchy eyes. At first had some cough. Admits to occasional heartburn  Review of Systems See above   Past Medical History:  Diagnosis Date   DJD (degenerative joint disease) 11/24/2013   Hidradenitis suppurativa     Past Surgical History:  Procedure Laterality Date   KNEE ARTHROSCOPY  06/26/2010   KNEE ARTHROSCOPY Left 05/01/2022   KNEE SURGERY Right 02/19/2022   replacement    Current Outpatient Medications  Medication Instructions   Adalimumab (HUMIRA PEN) 40 MG/0.4ML PNKT No dose, route, or frequency recorded.   ibuprofen (ADVIL) 200 mg, Oral, Every 6 hours PRN       Objective:   Physical Exam BP 132/80   Pulse 86   Temp 98.1 F (36.7 C) (Oral)   Resp 16   Ht 5\' 10"  (1.778 m)   Wt 251 lb 4 oz (114 kg)   SpO2 97%   BMI 36.05 kg/m  General:   Well developed, NAD, BMI noted. HEENT:  Normocephalic . Face symmetric, atraumatic Lungs:  CTA B Normal respiratory effort, no intercostal retractions, no accessory muscle use. Heart: RRR,  no murmur.  Lower extremities: no pretibial edema bilaterally  Skin: Not pale. Not jaundice Neurologic:  alert & oriented X3.  Speech normal, gait appropriate for age and unassisted Psych--  Cognition and judgment appear intact.  Cooperative with normal attention span and concentration.  Behavior appropriate. No anxious or depressed appearing.      Assessment   ASSESSMENT Hyperglycemia DJD 2015: Chest pain, ETT  test negative Hidradenitis suppurativa (follow-up by Derm, Humira)   PLAN Hyperglycemia: Unable to exercise due to DJD, we had a long conversation about the need to eat healthier,  less carbohydrates, more fruits and vegetables.  Check A1c CV RF: CV risk is slightly elevated, this was explained to the patient.  Check FLP. Allergies?  Dry itchy throat for 2 months, most likely allergies, recommend Flonase and Allegra. Hidradenitis suppurativa.  Humira on hold due to insurance issues but plans to restart RTC 6 months CPX

## 2022-09-26 NOTE — Patient Instructions (Addendum)
For allergies: - For the next few weeks use Flonase 2 sprays on each side of the nose daily. - You can also use Allegra 60 mg 1 tablet twice a days as needed.  This is over-the-counter as well.  Your sugar and cholesterol are borderline elevated, we are rechecking today.  Watch your diet closely.   Proceed with blood work  Come back for a physical exam in 6 months.    vaccines I recommend: RSV vaccine Flu shot every fall Shingrix (shingles)

## 2022-09-27 NOTE — Assessment & Plan Note (Signed)
Hyperglycemia: Unable to exercise due to DJD, we had a long conversation about the need to eat healthier, less carbohydrates, more fruits and vegetables.  Check A1c CV RF: CV risk is slightly elevated, this was explained to the patient.  Check FLP. Allergies?  Dry itchy throat for 2 months, most likely allergies, recommend Flonase and Allegra. Hidradenitis suppurativa.  Humira on hold due to insurance issues but plans to restart RTC 6 months CPX

## 2022-09-28 MED ORDER — ATORVASTATIN CALCIUM 20 MG PO TABS: 20.0000 mg | ORAL_TABLET | Freq: Every day | ORAL | 0 refills | Status: DC

## 2022-09-28 NOTE — Addendum Note (Signed)
Addended byDamita Dunnings D on: 09/28/2022 04:25 PM   Modules accepted: Orders

## 2022-10-04 DIAGNOSIS — M25562 Pain in left knee: Secondary | ICD-10-CM | POA: Diagnosis not present

## 2022-10-04 DIAGNOSIS — Z96652 Presence of left artificial knee joint: Secondary | ICD-10-CM | POA: Diagnosis not present

## 2022-10-04 DIAGNOSIS — Z471 Aftercare following joint replacement surgery: Secondary | ICD-10-CM | POA: Diagnosis not present

## 2022-12-19 ENCOUNTER — Other Ambulatory Visit: Payer: Self-pay | Admitting: Internal Medicine

## 2023-01-17 ENCOUNTER — Ambulatory Visit: Payer: Medicare (Managed Care) | Admitting: Internal Medicine

## 2023-01-17 ENCOUNTER — Encounter: Payer: Self-pay | Admitting: Internal Medicine

## 2023-01-19 ENCOUNTER — Telehealth: Payer: Self-pay

## 2023-01-19 NOTE — Telephone Encounter (Signed)
Received call from [place], [facility] w/ Rosann Auerbach, she wanted to inform us that Pt is out of atorvastatin and wanted to see if we would refill it. Informed that a prescription was sent to [facility] on 12/19/22 for 90 tablets and 1 refill, I also made her aware that he did no show his appt on 01/17/23. Annice Pih verbalized understanding. She will make Pt aware.

## 2023-01-22 ENCOUNTER — Ambulatory Visit (INDEPENDENT_AMBULATORY_CARE_PROVIDER_SITE_OTHER): Payer: Medicare (Managed Care) | Admitting: Internal Medicine

## 2023-01-22 ENCOUNTER — Encounter: Payer: Self-pay | Admitting: Internal Medicine

## 2023-01-22 VITALS — BP 134/82 | HR 60 | Temp 97.8°F | Resp 18 | Ht 70.0 in | Wt 247.4 lb

## 2023-01-22 DIAGNOSIS — E119 Type 2 diabetes mellitus without complications: Secondary | ICD-10-CM | POA: Insufficient documentation

## 2023-01-22 DIAGNOSIS — E785 Hyperlipidemia, unspecified: Secondary | ICD-10-CM

## 2023-01-22 MED ORDER — ATORVASTATIN CALCIUM 20 MG PO TABS: 20.0000 mg | ORAL_TABLET | Freq: Every day | ORAL | 1 refills | Status: DC

## 2023-01-22 NOTE — Patient Instructions (Addendum)
Diabetes: Check your diet closely. If you need to learn more about diet you can go to the following websites - [facility] - American diabetes Association  Restart atorvastatin, take 1 tablet every night  Vaccines I recommend: RSV vaccine Flu shot this fall Shingrix (shingles) Pneumonia shot (PNM 20)      GO TO THE FRONT DESK, PLEASE SCHEDULE YOUR APPOINTMENTS  Come back for fasting blood work in 4 weeks  Come back for a physical exam in 3 months

## 2023-01-22 NOTE — Progress Notes (Unsigned)
   Subjective:    Patient ID: SHREE KERNER, male    DOB: Oct 08, 1955, 67 y.o.   MRN: [id number]  DOS:  01/22/2023 Type of visit - description: Follow-up  Chronic medical problems addressed. At [facility] he was diagnosed with diabetes. Was also Rx atorvastatin , took it without problems, he ran out.   Review of Systems See above   Past Medical History:  Diagnosis Date   DJD (degenerative joint disease) 11/24/2013   Hidradenitis suppurativa     Past Surgical History:  Procedure Laterality Date   KNEE ARTHROSCOPY  06/26/2010   KNEE ARTHROSCOPY Left 05/01/2022   KNEE SURGERY Right 02/19/2022   replacement    Current Outpatient Medications  Medication Instructions   Adalimumab (HUMIRA PEN) 40 MG/0.4ML PNKT No dose, route, or frequency recorded.   atorvastatin (LIPITOR) 20 mg, Oral, Daily at bedtime   ibuprofen (ADVIL) 200 mg, Oral, Every 6 hours PRN       Objective:   Physical Exam BP 134/82   Pulse 60   Temp 97.8 F (36.6 C) (Oral)   Resp 18   Ht 5\' 10"  (1.778 m)   Wt 247 lb 6 oz (112.2 kg)   SpO2 99%   BMI 35.49 kg/m  General:   Well developed, NAD, BMI noted. HEENT:  Normocephalic . Face symmetric, atraumatic Lungs:  CTA B Normal respiratory effort, no intercostal retractions, no accessory muscle use. Heart: RRR,  no murmur.  DM foot exam: No edema, good pedal pulses, pinprick examination normal Skin: Not pale. Not jaundice Neurologic:  alert & oriented X3.  Speech normal, gait appropriate for age and unassisted Psych--  Cognition and judgment appear intact.  Cooperative with normal attention span and concentration.  Behavior appropriate. No anxious or depressed appearing.      Assessment     ASSESSMENT DM, A1c 6.6 (April 2024) DJD 2015: Chest pain, ETT  test negative Hidradenitis suppurativa (follow-up by Derm, Humira)   PLAN DM: Last A1c increased to 6.6.  Has diabetes, patient aware. We had a extensive discussion about what A1c means,  the  role of diet and exercise.  Encouraged a low starch diet and increase fruits and vegetables. Declined a nutritionist referral.  Feet exam was negative. Check A1c and micro in 4 weeks. Dyslipidemia: Recently diagnosed with DM consequently I started him on atorva, he took it for a while without apparent side effects, ran out 2 weeks ago. Plan: Refill atorvastatin, labs in 4 weeks. Aspirin: Recommend to start 81 mg daily Vaccine advice provided, he is strongly declining. RTC labs in 4 weeks, RTC CPX 3 months

## 2023-01-23 NOTE — Assessment & Plan Note (Signed)
DM: Last A1c increased to 6.6.  Has diabetes, patient aware. We had a extensive discussion about what A1c means,  the role of diet and exercise.  Encouraged a low starch diet and increase fruits and vegetables. Declined a nutritionist referral.  Feet exam was negative. Check A1c and micro in 4 weeks. Dyslipidemia: Recently diagnosed with DM consequently I started him on atorva, he took it for a while without apparent side effects, ran out 2 weeks ago. Plan: Refill atorvastatin, labs in 4 weeks. Aspirin: Recommend to start 81 mg daily Vaccine advice provided, he is strongly declining. RTC labs in 4 weeks, RTC CPX 3 months

## 2023-02-22 ENCOUNTER — Other Ambulatory Visit (INDEPENDENT_AMBULATORY_CARE_PROVIDER_SITE_OTHER): Payer: Medicare (Managed Care)

## 2023-02-22 DIAGNOSIS — E119 Type 2 diabetes mellitus without complications: Secondary | ICD-10-CM | POA: Diagnosis not present

## 2023-02-22 DIAGNOSIS — E785 Hyperlipidemia, unspecified: Secondary | ICD-10-CM

## 2023-02-22 LAB — LIPID PANEL
Cholesterol: 134 mg/dL (ref 0–200)
HDL: 43.3 mg/dL
LDL Cholesterol: 55 mg/dL (ref 0–99)
NonHDL: 91.05
Total CHOL/HDL Ratio: 3
Triglycerides: 180 mg/dL — ABNORMAL HIGH (ref 0.0–149.0)
VLDL: 36 mg/dL (ref 0.0–40.0)

## 2023-02-22 LAB — ALT: ALT: 19 U/L (ref 0–53)

## 2023-02-22 LAB — BASIC METABOLIC PANEL
BUN: 15 mg/dL (ref 6–23)
CO2: 30 mEq/L (ref 19–32)
Calcium: 9.8 mg/dL (ref 8.4–10.5)
Chloride: 103 mEq/L (ref 96–112)
Creatinine, Ser: 1.14 mg/dL (ref 0.40–1.50)
GFR: 67.37 mL/min (ref >60.00)
Glucose, Bld: 106 mg/dL — ABNORMAL HIGH (ref 70–99)
Potassium: 4.7 mEq/L (ref 3.5–5.1)
Sodium: 139 mEq/L (ref 135–145)

## 2023-02-22 LAB — AST: AST: 21 U/L (ref 0–37)

## 2023-02-22 LAB — MICROALBUMIN / CREATININE URINE RATIO
Creatinine,U: 265.7 mg/dL
Microalb Creat Ratio: 0.6 mg/g (ref 0.0–30.0)
Microalb, Ur: 1.6 mg/dL (ref 0.0–1.9)

## 2023-02-22 LAB — HEMOGLOBIN A1C: Hgb A1c MFr Bld: 6.3 % (ref 4.6–6.5)

## 2023-03-27 DIAGNOSIS — L732 Hidradenitis suppurativa: Secondary | ICD-10-CM | POA: Diagnosis not present

## 2023-03-27 DIAGNOSIS — L304 Erythema intertrigo: Secondary | ICD-10-CM | POA: Diagnosis not present

## 2023-03-29 LAB — CBC AND DIFFERENTIAL
HCT: 47 (ref 41–53)
Hemoglobin: 14.7 (ref 13.5–17.5)
Neutrophils Absolute: 6.5
Platelets: 285 10*3/uL (ref 150–400)
WBC: 11

## 2023-03-29 LAB — HEPATIC FUNCTION PANEL
ALT: 22 U/L (ref 10–40)
AST: 25 (ref 14–40)
Alkaline Phosphatase: 97 (ref 25–125)
Bilirubin, Direct: 0.2
Bilirubin, Total: 0.6

## 2023-03-29 LAB — LIPID PANEL
Cholesterol: 115 (ref 0–200)
HDL: 44 (ref 35–70)
LDL Cholesterol: 57
Triglycerides: 89 (ref 40–160)

## 2023-03-29 LAB — BASIC METABOLIC PANEL
BUN: 10 (ref 4–21)
CO2: 32 — AB (ref 13–22)
Chloride: 107 (ref 99–108)
Creatinine: 1.2 (ref 0.6–1.3)
Glucose: 110
Potassium: 4.7 meq/L (ref 3.5–5.1)
Sodium: 141 (ref 137–147)

## 2023-03-29 LAB — COMPREHENSIVE METABOLIC PANEL
Albumin: 4.6 (ref 3.5–5.0)
Calcium: 10.1 (ref 8.7–10.7)
eGFR: 70

## 2023-03-29 LAB — TSH: TSH: 1.12 (ref 0.41–5.90)

## 2023-03-29 LAB — PSA: PSA: 2.77

## 2023-03-30 ENCOUNTER — Other Ambulatory Visit: Payer: Self-pay | Admitting: Internal Medicine

## 2023-03-30 DIAGNOSIS — Z1211 Encounter for screening for malignant neoplasm of colon: Secondary | ICD-10-CM

## 2023-03-30 DIAGNOSIS — Z1212 Encounter for screening for malignant neoplasm of rectum: Secondary | ICD-10-CM

## 2023-04-06 ENCOUNTER — Encounter: Payer: Self-pay | Admitting: Internal Medicine

## 2023-04-09 ENCOUNTER — Encounter: Payer: Self-pay | Admitting: Internal Medicine

## 2023-04-09 ENCOUNTER — Encounter: Payer: Medicare (Managed Care) | Admitting: Internal Medicine

## 2023-04-18 DIAGNOSIS — Z1211 Encounter for screening for malignant neoplasm of colon: Secondary | ICD-10-CM | POA: Diagnosis not present

## 2023-04-18 DIAGNOSIS — Z1212 Encounter for screening for malignant neoplasm of rectum: Secondary | ICD-10-CM | POA: Diagnosis not present

## 2023-04-25 ENCOUNTER — Other Ambulatory Visit: Payer: Self-pay

## 2023-04-25 DIAGNOSIS — R195 Other fecal abnormalities: Secondary | ICD-10-CM

## 2023-04-25 LAB — COLOGUARD: COLOGUARD: POSITIVE — AB

## 2023-05-01 ENCOUNTER — Encounter: Payer: Self-pay | Admitting: Internal Medicine

## 2023-05-08 ENCOUNTER — Encounter: Payer: Self-pay | Admitting: Gastroenterology

## 2023-06-01 ENCOUNTER — Ambulatory Visit (AMBULATORY_SURGERY_CENTER): Payer: Medicare (Managed Care) | Admitting: *Deleted

## 2023-06-01 VITALS — Ht 67.0 in | Wt 239.0 lb

## 2023-06-01 DIAGNOSIS — R195 Other fecal abnormalities: Secondary | ICD-10-CM

## 2023-06-01 DIAGNOSIS — Z1211 Encounter for screening for malignant neoplasm of colon: Secondary | ICD-10-CM

## 2023-06-01 MED ORDER — NA SULFATE-K SULFATE-MG SULF 17.5-3.13-1.6 GM/177ML PO SOLN: 1.0000 | Freq: Once | ORAL | 0 refills | Status: AC

## 2023-06-01 NOTE — Progress Notes (Signed)
Pt's name and DOB verified at the beginning of the pre-visit wit 2 identifiers  Pt denies any difficulty with ambulating,sitting, laying down or rolling side to side  Pt has no issues with ambulation   Pt has no issues moving head neck or swallowing  No egg or soy allergy known to patient   No issues known to pt with past sedation with any surgeries or procedures  Pt denies having issues being intubated  No FH of Malignant Hyperthermia  Pt is not on diet pills or shots  Pt is not on home 02   Pt is not on blood thinners   Pt denies issues with constipation   Pt is not on dialysis  Pt denise any abnormal heart rhythms   Pt denies any upcoming cardiac testing  Pt encouraged to use to use Singlecare or Goodrx to reduce cost   Patient's chart reviewed by Cathlyn Parsons CNRA prior to pre-visit and patient appropriate for the [facility].  Pre-visit completed and red dot placed by patient's name on their procedure day (on provider's schedule).  .  Visit by phone  Pt states weight is 239 lb  Instructed pt why it is important to and  to call if they have any changes in health or new medications. Directed them to the # given and on instructions.     Instructions reviewed. Pt given both [facility] main # and MD on call # prior to instructions.  Pt states understanding. Instructed to review again prior to procedure. Pt states they will.   Instructions sent by mail with coupon and by My Chart   Coupon sent via text to mobile phone and pt verified they received it

## 2023-06-05 ENCOUNTER — Encounter: Payer: Self-pay | Admitting: Gastroenterology

## 2023-06-14 ENCOUNTER — Encounter: Payer: Self-pay | Admitting: Gastroenterology

## 2023-06-14 ENCOUNTER — Ambulatory Visit: Payer: Medicare (Managed Care) | Admitting: Gastroenterology

## 2023-06-14 VITALS — BP 117/81 | HR 73 | Temp 97.8°F | Resp 21 | Ht 67.0 in | Wt 239.0 lb

## 2023-06-14 DIAGNOSIS — K644 Residual hemorrhoidal skin tags: Secondary | ICD-10-CM

## 2023-06-14 DIAGNOSIS — D123 Benign neoplasm of transverse colon: Secondary | ICD-10-CM

## 2023-06-14 DIAGNOSIS — R195 Other fecal abnormalities: Secondary | ICD-10-CM

## 2023-06-14 DIAGNOSIS — K648 Other hemorrhoids: Secondary | ICD-10-CM

## 2023-06-14 DIAGNOSIS — D122 Benign neoplasm of ascending colon: Secondary | ICD-10-CM

## 2023-06-14 DIAGNOSIS — K5730 Diverticulosis of large intestine without perforation or abscess without bleeding: Secondary | ICD-10-CM | POA: Diagnosis not present

## 2023-06-14 DIAGNOSIS — Z1211 Encounter for screening for malignant neoplasm of colon: Secondary | ICD-10-CM

## 2023-06-14 MED ORDER — SODIUM CHLORIDE 0.9 % IV SOLN: 500.0000 mL | Freq: Once | INTRAVENOUS | Status: DC

## 2023-06-14 NOTE — Progress Notes (Signed)
Sedate, gd SR, tolerated procedure well, VSS, report to RN 

## 2023-06-14 NOTE — Progress Notes (Signed)
Called to room to assist during endoscopic procedure.  Patient ID and intended procedure confirmed with present staff. Received instructions for my participation in the procedure from the performing physician.  

## 2023-06-14 NOTE — Patient Instructions (Signed)
**  Handouts given on Polyps, hemorrhoids and diverticulosis**  YOU HAD AN ENDOSCOPIC PROCEDURE TODAY AT THE [facility]:   Refer to the procedure report that was given to you for any specific questions about what was found during the examination.  If the procedure report does not answer your questions, please call your gastroenterologist to clarify.  If you requested that your care partner not be given the details of your procedure findings, then the procedure report has been included in a sealed envelope for you to review at your convenience later.  YOU SHOULD EXPECT: Some feelings of bloating in the abdomen. Passage of more gas than usual.  Walking can help get rid of the air that was put into your GI tract during the procedure and reduce the bloating. If you had a lower endoscopy (such as a colonoscopy or flexible sigmoidoscopy) you may notice spotting of blood in your stool or on the toilet paper. If you underwent a bowel prep for your procedure, you may not have a normal bowel movement for a few days.  Please Note:  You might notice some irritation and congestion in your nose or some drainage.  This is from the oxygen used during your procedure.  There is no need for concern and it should clear up in a day or so.  SYMPTOMS TO REPORT IMMEDIATELY:  Following lower endoscopy (colonoscopy or flexible sigmoidoscopy):  Excessive amounts of blood in the stool  Significant tenderness or worsening of abdominal pains  Swelling of the abdomen that is new, acute  Fever of 100F or higher  For urgent or emergent issues, a gastroenterologist can be reached at any hour by calling [phone]. Do not use MyChart messaging for urgent concerns.    DIET:  We do recommend a small meal at first, but then you may proceed to your regular diet.  Drink plenty of fluids but you should avoid alcoholic beverages for 24 hours.  ACTIVITY:  You should plan to take it easy for the rest of today and you  should NOT DRIVE or use heavy machinery until tomorrow (because of the sedation medicines used during the test).    FOLLOW UP: Our staff will call the number listed on your records the next business day following your procedure.  We will call around 7:15- 8:00 am to check on you and address any questions or concerns that you may have regarding the information given to you following your procedure. If we do not reach you, we will leave a message.     If any biopsies were taken you will be contacted by phone or by letter within the next 1-3 weeks.  Please call us at [phone] if you have not heard about the biopsies in 3 weeks.    SIGNATURES/CONFIDENTIALITY: You and/or your care partner have signed paperwork which will be entered into your electronic medical record.  These signatures attest to the fact that that the information above on your After Visit Summary has been reviewed and is understood.  Full responsibility of the confidentiality of this discharge information lies with you and/or your care-partner.

## 2023-06-14 NOTE — Op Note (Signed)
[facility] Patient Name: Travis Solomon Procedure Date: 06/14/2023 10:28 AM MRN: [id number] Endoscopist: Napoleon Form , MD, [id number] Age: 67 Referring MD:  Date of Birth: [birth year] Gender: Male Account #: [id number] Procedure:                Colonoscopy Indications:              Positive Cologuard test Medicines:                Monitored Anesthesia Care Procedure:                Pre-Anesthesia Assessment:                           - Prior to the procedure, a History and Physical                            was performed, and patient medications and                            allergies were reviewed. The patient's tolerance of                            previous anesthesia was also reviewed. The risks                            and benefits of the procedure and the sedation                            options and risks were discussed with the patient.                            All questions were answered, and informed consent                            was obtained. Prior Anticoagulants: The patient has                            taken no anticoagulant or antiplatelet agents. ASA                            Grade Assessment: II - A patient with mild systemic                            disease. After reviewing the risks and benefits,                            the patient was deemed in satisfactory condition to                            undergo the procedure.                           After obtaining informed consent, the colonoscope  was passed under direct vision. Throughout the                            procedure, the patient's blood pressure, pulse, and                            oxygen saturations were monitored continuously. The                            Olympus Scope SN [id number] was introduced through the                            anus and advanced to the the cecum, identified by                            appendiceal orifice  and ileocecal valve. The                            colonoscopy was performed without difficulty. The                            patient tolerated the procedure well. The quality                            of the bowel preparation was good. The ileocecal                            valve, appendiceal orifice, and rectum were                            photographed. Scope In: 10:41:56 AM Scope Out: 10:56:13 AM Scope Withdrawal Time: 0 hours 11 minutes 44 seconds  Total Procedure Duration: 0 hours 14 minutes 17 seconds  Findings:                 The perianal and digital rectal examinations were                            normal.                           Three sessile polyps were found in the transverse                            colon and ascending colon. The polyps were 5 to 8                            mm in size. These polyps were removed with a cold                            snare. Resection and retrieval were complete.                           Scattered large-mouthed, medium-mouthed and  small-mouthed diverticula were found in the sigmoid                            colon and descending colon. There was evidence of                            an impacted diverticulum.                           Non-bleeding external and internal hemorrhoids were                            found during retroflexion. The hemorrhoids were                            medium-sized. Complications:            No immediate complications. Estimated Blood Loss:     Estimated blood loss was minimal. Impression:               - Three 5 to 8 mm polyps in the transverse colon                            and in the ascending colon, removed with a cold                            snare. Resected and retrieved.                           - Moderate diverticulosis in the sigmoid colon and                            in the descending colon. There was evidence of an                             impacted diverticulum.                           - Non-bleeding external and internal hemorrhoids. Recommendation:           - Patient has a contact number available for                            emergencies. The signs and symptoms of potential                            delayed complications were discussed with the                            patient. Return to normal activities tomorrow.                            Written discharge instructions were provided to the                            patient.                           -  Resume previous diet.                           - Continue present medications.                           - Await pathology results.                           - Repeat colonoscopy in 3 years for surveillance                            based on pathology results. Napoleon Form, MD 06/14/2023 11:03:49 AM This report has been signed electronically.

## 2023-06-14 NOTE — Progress Notes (Signed)
[facility] History and Physical   Primary Care Physician:  Wanda Plump, MD   Reason for Procedure:  Positive cologuard  Plan:    Colonoscopy with possible interventions as needed     HPI: Travis Solomon is a very pleasant 67 y.o. male here for colonoscopy for positive Cologuard.   The risks and benefits as well as alternatives of endoscopic procedure(s) have been discussed and reviewed. All questions answered. The patient agrees to proceed.    Past Medical History:  Diagnosis Date   DJD (degenerative joint disease) 11/24/2013   Hidradenitis suppurativa     Past Surgical History:  Procedure Laterality Date   KNEE ARTHROSCOPY  06/26/2010   KNEE ARTHROSCOPY Left 05/01/2022   KNEE SURGERY Right 02/19/2022   replacement   Suregery on arms Bilateral    Sweat galnds removed    Prior to Admission medications   Medication Sig Start Date End Date Taking? Authorizing Provider  omeprazole (PRILOSEC OTC) 20 MG tablet Take 20 mg by mouth daily.    [provider]  triamcinolone cream (KENALOG) 0.1 % As needed 03/27/23   [provider]    Current Outpatient Medications  Medication Sig Dispense Refill   omeprazole (PRILOSEC OTC) 20 MG tablet Take 20 mg by mouth daily.     triamcinolone cream (KENALOG) 0.1 % As needed     Current Facility-Administered Medications  Medication Dose Route Frequency Provider Last Rate Last Admin   0.9 %  sodium chloride infusion  500 mL Intravenous Once Napoleon Form, MD        Allergies as of 06/14/2023   (No Known Allergies)    Family History  Problem Relation Age of Onset   Diabetes Other        mother side    CAD Neg Hx    Stroke Neg Hx    Colon cancer Neg Hx    Prostate cancer Neg Hx    Colon polyps Neg Hx    Esophageal cancer Neg Hx    Stomach cancer Neg Hx    Rectal cancer Neg Hx     Social History   Socioeconomic History   Marital status: Married    Spouse name: Not on file    Number of children: 3   Years of education: Not on file   Highest education level: Not on file  Occupational History   Occupation: truck Clinical research associate: [employer]    Comment: to retire 2024  Tobacco Use   Smoking status: Never   Smokeless tobacco: Never  Substance and Sexual Activity   Alcohol use: No   Drug use: No   Sexual activity: Not on file  Other Topics Concern   Not on file  Social History Narrative   3 children, 2 alive   Household: pt, wife, daughter    Social Drivers of Corporate investment banker Strain: Not on file  Food Insecurity: No Food Insecurity (03/03/2020)   Received from [facility] visits prior to 08/26/2022., [facility] visits prior to 08/26/2022.   Hunger Vital Sign    Worried About Running Out of Food in the Last Year: Never true    Ran Out of Food in the Last Year: Never true  Transportation Needs: Not on file  Physical Activity: Not on file  Stress: Not on file  Social Connections: Not on file  Intimate Partner Violence: Not on file    Review of  Systems:  All other review of systems negative except as mentioned in the HPI.  Physical Exam: Vital signs in last 24 hours: BP 136/73   Pulse 78   Temp 97.8 F (36.6 C)   Ht 5\' 7"  (1.702 m)   Wt 239 lb (108.4 kg)   SpO2 97%   BMI 37.43 kg/m  General:   Alert, NAD Lungs:  Clear .   Heart:  Regular rate and rhythm Abdomen:  Soft, nontender and nondistended. Neuro/Psych:  Alert and cooperative. Normal mood and affect. A and O x 3  Reviewed labs, radiology imaging, old records and pertinent past GI work up  Patient is appropriate for planned procedure(s) and anesthesia in an ambulatory setting   K. Scherry Ran , MD [phone]

## 2023-06-15 ENCOUNTER — Telehealth: Payer: Self-pay | Admitting: *Deleted

## 2023-06-15 NOTE — Telephone Encounter (Signed)
  Follow up Call-     06/14/2023    9:55 AM  Call back number  Post procedure Call Back phone  # [phone]  Permission to leave phone message Yes     Patient questions:  Do you have a fever, pain , or abdominal swelling? No. Pain Score  0 *  Have you tolerated food without any problems? Yes.    Have you been able to return to your normal activities? Yes.    Do you have any questions about your discharge instructions: Diet   No. Medications  No. Follow up visit  No.  Do you have questions or concerns about your Care? No.  Actions: * If pain score is 4 or above: No action needed, pain <4.

## 2023-06-18 LAB — SURGICAL PATHOLOGY

## 2023-07-05 LAB — LIPID PANEL
Cholesterol: 199 (ref 0–200)
HDL: 51 (ref 35–70)
LDL Cholesterol: 137
Triglycerides: 95 (ref 40–160)

## 2023-07-05 LAB — HEMOGLOBIN A1C: Hemoglobin A1C: 6.3

## 2023-07-24 ENCOUNTER — Encounter: Payer: Self-pay | Admitting: Gastroenterology

## 2023-08-02 ENCOUNTER — Ambulatory Visit: Payer: Medicare (Managed Care)

## 2023-08-02 VITALS — Ht 68.0 in | Wt 247.0 lb

## 2023-08-02 DIAGNOSIS — Z0000 Encounter for general adult medical examination without abnormal findings: Secondary | ICD-10-CM | POA: Diagnosis not present

## 2023-08-02 NOTE — Progress Notes (Signed)
 Subjective:   Travis Solomon is a 68 y.o. male who presents for Medicare Annual/Subsequent preventive examination.  Visit Complete: Virtual I connected with  Travis Solomon on 08/02/23 by a audio enabled telemedicine application and verified that I am speaking with the correct person using two identifiers.  Patient Location: Home  Provider Location: Home Office  I discussed the limitations of evaluation and management by telemedicine. The patient expressed understanding and agreed to proceed.  Vital Signs: Because this visit was a virtual/telehealth visit, some criteria may be missing or patient reported. Any vitals not documented were not able to be obtained and vitals that have been documented are patient reported.       Objective:    Today's Vitals   08/02/23 0850  Weight: 247 lb (112 kg)  Height: 5' 8 (1.727 m)   Body mass index is 37.56 kg/m.     08/02/2023    8:57 AM 03/01/2016    4:49 AM  Advanced Directives  Does Patient Have a Medical Advance Directive? No No  Would patient like information on creating a medical advance directive? No - Patient declined     Current Medications (verified) Outpatient Encounter Medications as of 08/02/2023  Medication Sig   omeprazole  (PRILOSEC OTC) 20 MG tablet Take 20 mg by mouth daily.   triamcinolone cream (KENALOG) 0.1 % As needed   No facility-administered encounter medications on file as of 08/02/2023.    Allergies (verified) Patient has no known allergies.   History: Past Medical History:  Diagnosis Date   DJD (degenerative joint disease) 11/24/2013   Hidradenitis suppurativa    Past Surgical History:  Procedure Laterality Date   KNEE ARTHROSCOPY  06/26/2010   KNEE ARTHROSCOPY Left 05/01/2022   KNEE SURGERY Right 02/19/2022   replacement   Suregery on arms Bilateral    Sweat galnds removed   Family History  Problem Relation Age of Onset   Diabetes Other        mother side    CAD Neg Hx    Stroke Neg  Hx    Colon cancer Neg Hx    Prostate cancer Neg Hx    Colon polyps Neg Hx    Esophageal cancer Neg Hx    Stomach cancer Neg Hx    Rectal cancer Neg Hx    Social History   Socioeconomic History   Marital status: Married    Spouse name: Not on file   Number of children: 3   Years of education: Not on file   Highest education level: Not on file  Occupational History   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  Tobacco Use   Smoking status: Never   Smokeless tobacco: Never  Substance and Sexual Activity   Alcohol use: No   Drug use: No   Sexual activity: Not on file  Other Topics Concern   Not on file  Social History Narrative   3 children, 2 alive   Household: pt, wife, daughter    Social Drivers of Corporate Investment Banker Strain: Low Risk  (08/02/2023)   Overall Financial Resource Strain (CARDIA)    Difficulty of Paying Living Expenses: Not hard at all  Food Insecurity: No Food Insecurity (08/02/2023)   Hunger Vital Sign    Worried About Running Out of Food in the Last Year: Never true    Ran Out of Food in the Last Year: Never true  Transportation  Needs: No Transportation Needs (08/02/2023)   PRAPARE - Administrator, Civil Service (Medical): No    Lack of Transportation (Non-Medical): No  Physical Activity: Sufficiently Active (08/02/2023)   Exercise Vital Sign    Days of Exercise per Week: 6 days    Minutes of Exercise per Session: 140 min  Stress: No Stress Concern Present (08/02/2023)   [employer] of Occupational Health - Occupational Stress Questionnaire    Feeling of Stress : Not at all  Social Connections: Socially Integrated (08/02/2023)   Social Connection and Isolation Panel [NHANES]    Frequency of Communication with Friends and Family: More than three times a week    Frequency of Social Gatherings with Friends and Family: More than three times a week    Attends Religious Services: More than 4 times  per year    Active Member of [employer] or Organizations: Yes    Attends Engineer, Structural: More than 4 times per year    Marital Status: Married    Tobacco Counseling Counseling given: Not Answered   Clinical Intake:  Pre-visit preparation completed: Yes  Pain : No/denies pain     BMI - recorded: 37.56 Nutritional Status: BMI > 30  Obese Nutritional Risks: None Diabetes: Yes CBG done?: No Did pt. bring in CBG monitor from home?: No  How often do you need to have someone help you when you read instructions, pamphlets, or other written materials from your doctor or pharmacy?: 1 - Never  Interpreter Needed?: No  Information entered by :: Travis Solomon   Activities of Daily Living    08/02/2023    8:55 AM  In your present state of health, do you have any difficulty performing the following activities:  Hearing? 0  Vision? 0  Difficulty concentrating or making decisions? 0  Walking or climbing stairs? 0  Dressing or bathing? 0  Doing errands, shopping? 0  Preparing Food and eating ? N  Using the Toilet? N  In the past six months, have you accidently leaked urine? N  Do you have problems with loss of bowel control? N  Managing your Medications? N  Managing your Finances? N  Housekeeping or managing your Housekeeping? N    Patient Care Team: Travis Aloysius BRAVO, MD as PCP - General (Internal Medicine) Pichardo-Geisinger, Travis KIDD, MD as Referring Physician (Dermatology)  Indicate any recent Medical Services you may have received from other than [facility] providers in the past year (date may be approximate).     Assessment:   This is a routine wellness examination for Travis Solomon.  Hearing/Vision screen Hearing Screening - Comments:: Denies hearing difficulties   Vision Screening - Comments:: Wears rx glasses - up to date with routine eye exams with  [facility]   Goals Addressed               This Visit's Progress     Lose weight (pt-stated)         Control my A1C.       Depression Screen    08/02/2023    8:54 AM 01/22/2023    1:16 PM 09/26/2022    8:22 AM 08/07/2022    8:05 AM 03/27/2022    2:22 PM 12/26/2021    4:11 PM 03/15/2018    2:52 PM  PHQ 2/9 Scores  PHQ - 2 Score 0 0 0 0 0 0 0    Fall Risk    08/02/2023    8:56 AM 01/22/2023  1:16 PM 09/26/2022    8:22 AM 08/07/2022    8:05 AM 03/27/2022    2:22 PM  Fall Risk   Falls in the past year? 0 0 0 0 0  Number falls in past yr: 0 0 0 0 0  Injury with Fall? 0 0 0 0 0  Risk for fall due to : No Fall Risks      Follow up Falls prevention discussed;Falls evaluation completed Falls evaluation completed Falls evaluation completed Falls evaluation completed Falls evaluation completed    MEDICARE RISK AT HOME: Medicare Risk at Home Any stairs in or around the home?: No If so, are there any without handrails?: No Home free of loose throw rugs in walkways, pet beds, electrical cords, etc?: Yes Adequate lighting in your home to reduce risk of falls?: Yes Life alert?: No Use of a cane, walker or w/c?: No Grab bars in the bathroom?: Yes Shower chair or bench in shower?: No Elevated toilet seat or a handicapped toilet?: No  TIMED UP AND GO:  Was the test performed?  No    Cognitive Function:        08/02/2023    8:58 AM  6CIT Screen  What Year? 0 points  What month? 0 points  What time? 0 points  Count back from 20 0 points  Months in reverse 0 points  Repeat phrase 0 points  Total Score 0 points    Immunizations Immunization History  Administered Date(s) Administered   PFIZER(Purple Top)SARS-COV-2 Vaccination 10/04/2019, 10/27/2019   Polio, Unspecified 11/10/1957, 02/09/1958, 03/16/1958, 12/11/1960   Smallpox 07/16/1961   Tdap 03/15/2018    TDAP status: Up to date  Flu Vaccine status: Declined, Education has been provided regarding the importance of this vaccine but patient still declined. Advised may receive this vaccine at local pharmacy or Health Dept. Aware  to provide a copy of the vaccination record if obtained from local pharmacy or Health Dept. Verbalized acceptance and understanding.  Pneumococcal vaccine status: Declined,  Education has been provided regarding the importance of this vaccine but patient still declined. Advised may receive this vaccine at local pharmacy or Health Dept. Aware to provide a copy of the vaccination record if obtained from local pharmacy or Health Dept. Verbalized acceptance and understanding.      Screening Tests Health Maintenance  Topic Date Due   OPHTHALMOLOGY EXAM  Never done   INFLUENZA VACCINE  09/24/2023 (Originally 01/25/2023)   Pneumonia Vaccine 77+ Years old (1 of 2 - PCV) 09/26/2023 (Originally 05/16/1962)   HEMOGLOBIN A1C  08/24/2023   FOOT EXAM  01/22/2024   Diabetic kidney evaluation - Urine ACR  02/22/2024   Diabetic kidney evaluation - eGFR measurement  03/28/2024   Medicare Annual Wellness (AWV)  08/01/2024   Fecal DNA (Cologuard)  04/17/2026   DTaP/Tdap/Td (2 - Td or Tdap) 03/15/2028   Hepatitis C Screening  Completed   HPV VACCINES  Aged Out   Colonoscopy  Discontinued   COVID-19 Vaccine  Discontinued   Zoster Vaccines- Shingrix  Discontinued    Health Maintenance  Health Maintenance Due  Topic Date Due   OPHTHALMOLOGY EXAM  Never done    Additional Screening:  Hepatitis C Screening: does qualify; Completed 04/05/20  Vision Screening: Recommended annual ophthalmology exams for early detection of glaucoma and other disorders of the eye. Is the patient up to date with their annual eye exam?  No  Who is the provider or what is the name of the office in which the  patient attends annual eye exams? [facility] If pt is not established with a provider, would they like to be referred to a provider to establish care? No .   Dental Screening: Recommended annual dental exams for proper oral hygiene    Community Resource Referral / Chronic Care Management:  CRR required this visit?   No   CCM required this visit?  No     Plan:     I have personally reviewed and noted the following in the patient's chart:   Medical and social history Use of alcohol, tobacco or illicit drugs  Current medications and supplements including opioid prescriptions. Patient is not currently taking opioid prescriptions. Functional ability and status Nutritional status Physical activity Advanced directives List of other physicians Hospitalizations, surgeries, and ER visits in previous 12 months Vitals Screenings to include cognitive, depression, and falls Referrals and appointments  In addition, I have reviewed and discussed with patient certain preventive protocols, quality metrics, and best practice recommendations. A written personalized care plan for preventive services as well as general preventive health recommendations were provided to patient.     Travis LELON Blush, Solomon   12/26/7972   After Visit Summary: (MyChart) Due to this being a telephonic visit, the after visit summary with patients personalized plan was offered to patient via MyChart   Nurse Notes: None

## 2023-08-02 NOTE — Patient Instructions (Addendum)
 Mr. Clugston , Thank you for taking time to come for your Medicare Wellness Visit. I appreciate your ongoing commitment to your health goals. Please review the following plan we discussed and let me know if I can assist you in the future.   Referrals/Orders/Follow-Ups/Clinician Recommendations:   This is a list of the screening recommended for you and due dates:  Health Maintenance  Topic Date Due   Eye exam for diabetics  Never done   Flu Shot  09/24/2023*   Pneumonia Vaccine (1 of 2 - PCV) 09/26/2023*   Hemoglobin A1C  08/24/2023   Complete foot exam   01/22/2024   Yearly kidney health urinalysis for diabetes  02/22/2024   Yearly kidney function blood test for diabetes  03/28/2024   Medicare Annual Wellness Visit  08/01/2024   Cologuard (Stool DNA test)  04/17/2026   DTaP/Tdap/Td vaccine (2 - Td or Tdap) 03/15/2028   Hepatitis C Screening  Completed   HPV Vaccine  Aged Out   Colon Cancer Screening  Discontinued   COVID-19 Vaccine  Discontinued   Zoster (Shingles) Vaccine  Discontinued  *Topic was postponed. The date shown is not the original due date.    Advanced directives: (Declined) Advance directive discussed with you today. Even though you declined this today, please call our office should you change your mind, and we can give you the proper paperwork for you to fill out.  Next Medicare Annual Wellness Visit scheduled for next year: Yes

## 2023-08-15 LAB — HM DIABETES EYE EXAM

## 2023-08-28 NOTE — Progress Notes (Unsigned)
 08/29/2023 Travis Solomon [id number] Feb 08, 1956  Referring provider: Kathrin Penner, PA-C Primary GI doctor: Dr. Lavon Paganini  ASSESSMENT AND PLAN:   GERD with dysphagia increased sensation last 5 years  Worse since knee surgery last year, was on advil for years but this stopped 01/2023, has had weight gain On prilosec 20 mg daily for 6 months helped some but symptoms persisted Has 3 shots of liquor every other day for years Remote history of smoking 40 years ago, 10 years history Lifestyle changes discussed, avoid NSAIDS, ETOH, hand out given to the patient Weight loss discussed with the patient Continue the omeprazole 40 mg twice a day, emphasized PPI 30 minutes- 1hour before food I discussed risks of EGD with dilation with patient today, including risk of sedation, bleeding or perforation.  Patient provides understanding and gave verbal consent to proceed. If negative can follow up with ENT for possible pharyngitis  Personal history of TA polyps Colon 06/14/2023 due to positive cologuard Recall 05/2026  Diabetes Not on medications  Morbid obesity  Body mass index is 38.8 kg/m.  -Patient has been advised to make an attempt to improve diet and exercise patterns to aid in weight loss. -Recommended diet heavy in fruits and veggies and low in animal meats, cheeses, and dairy products, appropriate calorie intake    Patient Care Team: Wanda Plump, MD as PCP - General (Internal Medicine) Pichardo-Geisinger, Robby Sermon, MD as Referring Physician (Dermatology)  HISTORY OF PRESENT ILLNESS: Discussed the use of AI scribe software for clinical note transcription with the patient, who gave verbal consent to proceed.  History of Present Illness   Travis Solomon is a 68 year old male with type two diabetes and morbid obesity who presents with GERD and pharyngitis.  He has been experiencing a dry throat and coughing up mucus, particularly when bending over, which he  associates with symptoms of acid reflux. These symptoms have been worsening over the past five years, especially since his knee surgery last year. He has been on omeprazole for about six months, initially at 20 mg once daily, now increased to 40 mg twice daily, which has somewhat calmed the symptoms but not completely resolved them.  He describes chronic dysphagia, noting difficulty swallowing, especially in the mornings. Drinking water or using cough drops provides some relief. He experiences a sensation of pills or food getting stuck in his chest, particularly when taking medication in the morning. This sensation is inconsistent, occurring on some days but not others. Increased physical activity and hydration have eased the symptoms somewhat.  His past medical history includes type two diabetes, morbid obesity, and adenomatous polyps. A colonoscopy performed on June 14, 2023, revealed three tubular adenomatous polyps, diverticulosis, and internal and external hemorrhoids. The polyps were negative for high-grade dysplasia.  He reports a history of alcohol use, consuming liquor every other day, approximately three shots each time. He has a past smoking history, having quit about 40 years ago after smoking for 10 years. He denies any current use of NSAIDs such as Advil or Aleve since his knee surgery last year. No anemia, shortness of breath, chest discomfort, abdominal pain, or discomfort. No recent imaging. No neck surgery or radiation. No blood thinners except for baby aspirin.     He  reports that he has never smoked. He has never used smokeless tobacco. He reports that he does not drink alcohol and does not use drugs.  RELEVANT GI HISTORY, IMAGING AND LABS: Colon 06/14/2023 for  positive cologuard - Three 5 to 8 mm polyps in the transverse colon and in the ascending colon, removed with a cold snare. Resected and retrieved. - Moderate diverticulosis in the sigmoid colon and in the descending colon.  There was evidence of an impacted diverticulum. - Non- bleeding external and internal hemorrhoids.  CBC    Component Value Date/Time   WBC 11.0 03/29/2023 0000   WBC 7.6 03/27/2022 1459   RBC 6.02 (H) 03/27/2022 1459   HGB 14.7 03/29/2023 0000   HCT 47 03/29/2023 0000   PLT 285 03/29/2023 0000   MCV 72.9 (L) 03/27/2022 1459   MCH 22.9 (L) 12/26/2021 1618   MCHC 30.8 03/27/2022 1459   RDW 15.7 (H) 03/27/2022 1459   LYMPHSABS 2.9 03/27/2022 1459   MONOABS 0.7 03/27/2022 1459   EOSABS 0.3 03/27/2022 1459   BASOSABS 0.1 03/27/2022 1459   Recent Labs    03/29/23 0000  HGB 14.7    CMP     Component Value Date/Time   NA 141 03/29/2023 0000   K 4.7 03/29/2023 0000   CL 107 03/29/2023 0000   CO2 32 (A) 03/29/2023 0000   GLUCOSE 106 (H) 02/22/2023 1309   BUN 10 03/29/2023 0000   CREATININE 1.2 03/29/2023 0000   CREATININE 1.14 02/22/2023 1309   CREATININE 1.11 12/26/2021 1618   CALCIUM 10.1 03/29/2023 0000   PROT 7.1 12/26/2021 1618   ALBUMIN 4.6 03/29/2023 0000   AST 25 03/29/2023 0000   ALT 22 03/29/2023 0000   ALKPHOS 97 03/29/2023 0000   BILITOT 0.5 12/26/2021 1618   GFRNONAA >89 07/02/2015 1740   GFRAA >89 07/02/2015 1740      Latest Ref Rng & Units 03/29/2023   12:00 AM 02/22/2023    1:09 PM 12/26/2021    4:18 PM  Hepatic Function  Total Protein 6.1 - 8.1 g/dL   7.1   Albumin 3.5 - 5.0 4.6        AST 14 - 40 25     21  14    ALT 10 - 40 U/L 22     19  13    Alk Phosphatase 25 - 125 97        Total Bilirubin 0.2 - 1.2 mg/dL   0.5   Bilirubin, Direct 0.01 - 0.4 0.2           This result is from an external source.      Current Medications:        Current Outpatient Medications (Other):    NON FORMULARY, Take by mouth as needed. Cough drops   omeprazole (PRILOSEC OTC) 20 MG tablet, Take 20 mg by mouth daily.   triamcinolone cream (KENALOG) 0.1 %, As needed  Medical History:  Past Medical History:  Diagnosis Date   DJD (degenerative joint disease)  11/24/2013   GERD (gastroesophageal reflux disease)    Hidradenitis suppurativa    Allergies: No Known Allergies   Surgical History:  He  has a past surgical history that includes Knee arthroscopy (06/26/2010); Knee surgery (Right, 02/19/2022); Knee arthroscopy (Left, 05/01/2022); and Suregery on arms (Bilateral). Family History:  His family history includes Diabetes in an other family member.  REVIEW OF SYSTEMS  : All other systems reviewed and negative except where noted in the History of Present Illness.  PHYSICAL EXAM: BP (!) 144/90 (BP Location: Left Arm, Patient Position: Sitting, Cuff Size: Large)   Pulse 73   Ht 5\' 8"  (1.727 m)   Wt 255 lb 3.2 oz (115.8  kg)   BMI 38.80 kg/m  Physical Exam   GENERAL APPEARANCE: Obese in no apparent distress. HEENT: No cervical lymphadenopathy, thyroid normal, sclerae anicteric, conjunctiva pink. RESPIRATORY: Respiratory effort normal, breath sounds clear to auscultation bilaterally without rales, rhonchi, or wheezing. CARDIO: Regular rate and rhythm with no murmurs, rubs, or gallops, peripheral pulses intact. ABDOMEN: Soft, non-distended, active bowel sounds in all four quadrants, non-tender, no pain in right upper quadrant, no rebound, no mass appreciated. RECTAL: Declines. MUSCULOSKELETAL: Full range of motion, normal gait, without edema. SKIN: Dry, intact without rashes or lesions. No jaundice. NEURO: Alert, oriented, no focal deficits. PSYCH: Cooperative, normal mood and affect. NECK: No cervical lymphadenopathy, thyroid normal.      Doree Albee, PA-C 9:50 AM

## 2023-08-29 ENCOUNTER — Encounter: Payer: Self-pay | Admitting: Physician Assistant

## 2023-08-29 ENCOUNTER — Ambulatory Visit: Payer: Medicare (Managed Care) | Admitting: Physician Assistant

## 2023-08-29 VITALS — BP 144/90 | HR 73 | Ht 68.0 in | Wt 255.2 lb

## 2023-08-29 DIAGNOSIS — R1310 Dysphagia, unspecified: Secondary | ICD-10-CM | POA: Diagnosis not present

## 2023-08-29 DIAGNOSIS — E119 Type 2 diabetes mellitus without complications: Secondary | ICD-10-CM | POA: Diagnosis not present

## 2023-08-29 DIAGNOSIS — Z6838 Body mass index (BMI) 38.0-38.9, adult: Secondary | ICD-10-CM

## 2023-08-29 DIAGNOSIS — Z6832 Body mass index (BMI) 32.0-32.9, adult: Secondary | ICD-10-CM

## 2023-08-29 DIAGNOSIS — K219 Gastro-esophageal reflux disease without esophagitis: Secondary | ICD-10-CM

## 2023-08-29 DIAGNOSIS — Z860101 Personal history of adenomatous and serrated colon polyps: Secondary | ICD-10-CM

## 2023-08-29 DIAGNOSIS — D122 Benign neoplasm of ascending colon: Secondary | ICD-10-CM

## 2023-08-29 NOTE — Patient Instructions (Addendum)
 Please take your proton pump inhibitor medication, omeprazole 40 mg twice  Please take this medication 30 minutes to 1 hour before meals- this makes it more effective.  Avoid spicy and acidic foods Avoid fatty foods Limit your intake of coffee, tea, alcohol, and carbonated drinks Work to maintain a healthy weight Keep the head of the bed elevated at least 3 inches with blocks or a wedge pillow if you are having any nighttime symptoms Stay upright for 2 hours after eating Avoid meals and snacks three to four hours before bedtime  _______________________________________________________  If your blood pressure at your visit was 140/90 or greater, please contact your primary care physician to follow up on this.  _______________________________________________________  If you are age 57 or older, your body mass index should be between 23-30. Your Body mass index is 38.8 kg/m. If this is out of the aforementioned range listed, please consider follow up with your Primary Care Provider.  If you are age 40 or younger, your body mass index should be between 19-25. Your Body mass index is 38.8 kg/m. If this is out of the aformentioned range listed, please consider follow up with your Primary Care Provider.   ________________________________________________________  The [facility] providers would like to encourage you to use [facility] to communicate with providers for non-urgent requests or questions.  Due to long hold times on the telephone, sending your provider a message by [facility] may be a faster and more efficient way to get a response.  Please allow 48 business hours for a response.  Please remember that this is for non-urgent requests.  _______________________________________________________ It was a pleasure to see you today!  Thank you for trusting me with your gastrointestinal care!

## 2023-09-05 ENCOUNTER — Ambulatory Visit: Payer: Medicare (Managed Care) | Admitting: Gastroenterology

## 2023-09-05 ENCOUNTER — Encounter: Payer: Self-pay | Admitting: Gastroenterology

## 2023-09-05 VITALS — BP 137/85 | HR 77 | Temp 98.2°F | Resp 19 | Ht 68.0 in | Wt 255.0 lb

## 2023-09-05 DIAGNOSIS — K3189 Other diseases of stomach and duodenum: Secondary | ICD-10-CM

## 2023-09-05 DIAGNOSIS — K219 Gastro-esophageal reflux disease without esophagitis: Secondary | ICD-10-CM

## 2023-09-05 DIAGNOSIS — K319 Disease of stomach and duodenum, unspecified: Secondary | ICD-10-CM

## 2023-09-05 DIAGNOSIS — R1310 Dysphagia, unspecified: Secondary | ICD-10-CM | POA: Diagnosis not present

## 2023-09-05 DIAGNOSIS — K2970 Gastritis, unspecified, without bleeding: Secondary | ICD-10-CM

## 2023-09-05 MED ORDER — SODIUM CHLORIDE 0.9 % IV SOLN: 500.0000 mL | Freq: Once | INTRAVENOUS | Status: DC

## 2023-09-05 NOTE — Patient Instructions (Addendum)
 Educational handout provided to patient related to Gastritis  Resume previous diet  Continue present medications  Awaiting pathology results  Follow antireflux regimen   YOU HAD AN ENDOSCOPIC PROCEDURE TODAY AT THE [facility]:   Refer to the procedure report that was given to you for any specific questions about what was found during the examination.  If the procedure report does not answer your questions, please call your gastroenterologist to clarify.  If you requested that your care partner not be given the details of your procedure findings, then the procedure report has been included in a sealed envelope for you to review at your convenience later.  YOU SHOULD EXPECT: Some feelings of bloating in the abdomen. Passage of more gas than usual.  Walking can help get rid of the air that was put into your GI tract during the procedure and reduce the bloating. If you had a lower endoscopy (such as a colonoscopy or flexible sigmoidoscopy) you may notice spotting of blood in your stool or on the toilet paper. If you underwent a bowel prep for your procedure, you may not have a normal bowel movement for a few days.  Please Note:  You might notice some irritation and congestion in your nose or some drainage.  This is from the oxygen used during your procedure.  There is no need for concern and it should clear up in a day or so.  SYMPTOMS TO REPORT IMMEDIATELY:  Following upper endoscopy (EGD)  Vomiting of blood or coffee ground material  New chest pain or pain under the shoulder blades  Painful or persistently difficult swallowing  New shortness of breath  Fever of 100F or higher  Black, tarry-looking stools  For urgent or emergent issues, a gastroenterologist can be reached at any hour by calling [phone]. Do not use MyChart messaging for urgent concerns.    DIET:  We do recommend a small meal at first, but then you may proceed to your regular diet.  Drink plenty of  fluids but you should avoid alcoholic beverages for 24 hours.  ACTIVITY:  You should plan to take it easy for the rest of today and you should NOT DRIVE or use heavy machinery until tomorrow (because of the sedation medicines used during the test).    FOLLOW UP: Our staff will call the number listed on your records the next business day following your procedure.  We will call around 7:15- 8:00 am to check on you and address any questions or concerns that you may have regarding the information given to you following your procedure. If we do not reach you, we will leave a message.     If any biopsies were taken you will be contacted by phone or by letter within the next 1-3 weeks.  Please call us at [phone] if you have not heard about the biopsies in 3 weeks.    SIGNATURES/CONFIDENTIALITY: You and/or your care partner have signed paperwork which will be entered into your electronic medical record.  These signatures attest to the fact that that the information above on your After Visit Summary has been reviewed and is understood.  Full responsibility of the confidentiality of this discharge information lies with you and/or your care-partner.

## 2023-09-05 NOTE — Progress Notes (Unsigned)
 Vss nad trans to pacu

## 2023-09-05 NOTE — Progress Notes (Unsigned)
 Pt's states no medical or surgical changes since previsit or office visit.

## 2023-09-05 NOTE — Progress Notes (Unsigned)
 [facility] History and Physical   Primary Care Physician:  Wanda Plump, MD   Reason for Procedure:  GERD, dysphagia  Plan:    EGD  with possible interventions as needed     HPI: Travis Solomon is a very pleasant 68 y.o. male here for EGD for esophageal dilation for GERD and dysphagia.   The risks and benefits as well as alternatives of endoscopic procedure(s) have been discussed and reviewed. All questions answered. The patient agrees to proceed.    Past Medical History:  Diagnosis Date   DJD (degenerative joint disease) 11/24/2013   GERD (gastroesophageal reflux disease)    Hidradenitis suppurativa     Past Surgical History:  Procedure Laterality Date   KNEE ARTHROSCOPY  06/26/2010   KNEE ARTHROSCOPY Left 05/01/2022   KNEE SURGERY Right 02/19/2022   replacement   Suregery on arms Bilateral    Sweat galnds removed    Prior to Admission medications   Medication Sig Start Date End Date Taking? Authorizing Provider  NON FORMULARY Take by mouth as needed. Cough drops   Yes [provider]  omeprazole (PRILOSEC OTC) 20 MG tablet Take 20 mg by mouth daily.   Yes [provider]  triamcinolone cream (KENALOG) 0.1 % As needed 03/27/23  Yes [provider]    Current Outpatient Medications  Medication Sig Dispense Refill   NON FORMULARY Take by mouth as needed. Cough drops     omeprazole (PRILOSEC OTC) 20 MG tablet Take 20 mg by mouth daily.     triamcinolone cream (KENALOG) 0.1 % As needed     Current Facility-Administered Medications  Medication Dose Route Frequency Provider Last Rate Last Admin   0.9 %  sodium chloride infusion  500 mL Intravenous Once Napoleon Form, MD        Allergies as of 09/05/2023   (No Known Allergies)    Family History  Problem Relation Age of Onset   Diabetes Other        mother side    CAD Neg Hx    Stroke Neg Hx    Colon cancer Neg Hx    Prostate cancer Neg Hx    Colon polyps  Neg Hx    Esophageal cancer Neg Hx    Stomach cancer Neg Hx    Rectal cancer Neg Hx     Social History   Socioeconomic History   Marital status: Married    Spouse name: Not on file   Number of children: 3   Years of education: Not on file   Highest education level: Not on file  Occupational History   Occupation: truck Clinical research associate: [employer]    Comment: to retire 2024  Tobacco Use   Smoking status: Never   Smokeless tobacco: Never  Substance and Sexual Activity   Alcohol use: No   Drug use: No   Sexual activity: Not on file  Other Topics Concern   Not on file  Social History Narrative   3 children, 2 alive   Household: pt, wife, daughter    Social Drivers of Corporate investment banker Strain: Low Risk  (08/02/2023)   Overall Financial Resource Strain (CARDIA)    Difficulty of Paying Living Expenses: Not hard at all  Food Insecurity: No Food Insecurity (08/02/2023)   Hunger Vital Sign    Worried About Running Out of Food in the Last Year: Never true    Ran Out of Food in the  Last Year: Never true  Transportation Needs: No Transportation Needs (08/02/2023)   PRAPARE - Administrator, Civil Service (Medical): No    Lack of Transportation (Non-Medical): No  Physical Activity: Sufficiently Active (08/02/2023)   Exercise Vital Sign    Days of Exercise per Week: 6 days    Minutes of Exercise per Session: 140 min  Stress: No Stress Concern Present (08/02/2023)   [employer] of Occupational Health - Occupational Stress Questionnaire    Feeling of Stress : Not at all  Social Connections: Socially Integrated (08/02/2023)   Social Connection and Isolation Panel [NHANES]    Frequency of Communication with Friends and Family: More than three times a week    Frequency of Social Gatherings with Friends and Family: More than three times a week    Attends Religious Services: More than 4 times per year    Active Member of [employer] or Organizations: Yes    Attends  Engineer, structural: More than 4 times per year    Marital Status: Married  Catering manager Violence: Not At Risk (08/02/2023)   Humiliation, Afraid, Rape, and Kick questionnaire    Fear of Current or Ex-Partner: No    Emotionally Abused: No    Physically Abused: No    Sexually Abused: No    Review of Systems:  All other review of systems negative except as mentioned in the HPI.  Physical Exam: Vital signs in last 24 hours: BP 133/79   Pulse 79   Temp 98.2 F (36.8 C) (Temporal)   Ht 5\' 8"  (1.727 m)   Wt 255 lb (115.7 kg)   SpO2 97%   BMI 38.77 kg/m  General:   Alert, NAD Lungs:  Clear .   Heart:  Regular rate and rhythm Abdomen:  Soft, nontender and nondistended. Neuro/Psych:  Alert and cooperative. Normal mood and affect. A and O x 3  Reviewed labs, radiology imaging, old records and pertinent past GI work up  Patient is appropriate for planned procedure(s) and anesthesia in an ambulatory setting   K. Scherry Ran , MD [phone]

## 2023-09-05 NOTE — Op Note (Signed)
 [facility] Patient Name: Travis Solomon Procedure Date: 09/05/2023 1:16 PM MRN: [id number] Endoscopist: Napoleon Form , MD, [id number] Age: 68 Referring MD:  Date of Birth: 09-Sep-1955 Gender: Male Account #: [id number] Procedure:                Upper GI endoscopy Indications:              Dysphagia, Suspected gastro-esophageal reflux                            disease Medicines:                Monitored Anesthesia Care Procedure:                Pre-Anesthesia Assessment:                           - Prior to the procedure, a History and Physical                            was performed, and patient medications and                            allergies were reviewed. The patient's tolerance of                            previous anesthesia was also reviewed. The risks                            and benefits of the procedure and the sedation                            options and risks were discussed with the patient.                            All questions were answered, and informed consent                            was obtained. Prior Anticoagulants: The patient has                            taken no anticoagulant or antiplatelet agents. ASA                            Grade Assessment: II - A patient with mild systemic                            disease. After reviewing the risks and benefits,                            the patient was deemed in satisfactory condition to                            undergo the procedure.  After obtaining informed consent, the endoscope was                            passed under direct vision. Throughout the                            procedure, the patient's blood pressure, pulse, and                            oxygen saturations were monitored continuously. The                            GIF [id number] #[id number] was introduced through the                            mouth, and advanced to the second part of  duodenum.                            The upper GI endoscopy was accomplished without                            difficulty. The patient tolerated the procedure                            well. Scope In: Scope Out: Findings:                 The Z-line was regular and was found 40 cm from the                            incisors.                           No endoscopic abnormality was evident in the                            esophagus to explain the patient's complaint of                            dysphagia. The scope was withdrawn. Dilation was                            performed with a Maloney dilator with no resistance                            at 54 Fr. The dilation site was examined following                            endoscope reinsertion and showed no change.                           Patchy mild inflammation characterized by                            congestion (edema), erythema and  friability was                            found in the entire examined stomach. Biopsies were                            taken with a cold forceps for Helicobacter pylori                            testing.                           The cardia and gastric fundus were normal on                            retroflexion.                           The examined duodenum was normal. Complications:            No immediate complications. Estimated Blood Loss:     Estimated blood loss was minimal. Impression:               - Z-line regular, 40 cm from the incisors.                           - No endoscopic esophageal abnormality to explain                            patient's dysphagia. Dilated.                           - Gastritis. Biopsied.                           - Normal examined duodenum. Recommendation:           - Patient has a contact number available for                            emergencies. The signs and symptoms of potential                            delayed complications were discussed  with the                            patient. Return to normal activities tomorrow.                            Written discharge instructions were provided to the                            patient.                           - Resume previous diet.                           -  Continue present medications.                           - Await pathology results.                           - Follow an antireflux regimen. Napoleon Form, MD 09/05/2023 1:50:12 PM This report has been signed electronically.

## 2023-09-06 ENCOUNTER — Telehealth: Payer: Self-pay | Admitting: *Deleted

## 2023-09-06 NOTE — Telephone Encounter (Signed)
  Follow up Call-     09/05/2023   12:52 PM 06/14/2023    9:55 AM  Call back number  Post procedure Call Back phone  # [phone]  Permission to leave phone message Yes Yes     Patient questions:  Do you have a fever, pain , or abdominal swelling? No. Pain Score  0 *  Have you tolerated food without any problems? Yes.    Have you been able to return to your normal activities? Yes.    Do you have any questions about your discharge instructions: Diet   No. Medications  No. Follow up visit  No.  Do you have questions or concerns about your Care? No.  Actions: * If pain score is 4 or above: No action needed, pain <4.

## 2023-09-10 LAB — SURGICAL PATHOLOGY

## 2023-11-05 ENCOUNTER — Encounter: Payer: Self-pay | Admitting: Gastroenterology

## 2023-11-15 ENCOUNTER — Encounter: Payer: Self-pay | Admitting: Pharmacist

## 2023-11-15 NOTE — Progress Notes (Signed)
 Pharmacy Quality Measure Review  This patient was noted to have failed MAC - adherence measure for cholesterol medication in 2024.  He last filled atorvastatin  01/22/2023.   Last lipids panel from 03/29/2023 showed LDL was 57. Patient was to continue atorvastatin  but 06/01/2023 at GI appointment it was noted to be discontinued due to patient preference.  09/2022 direct LDL was 136 and 03/27/2022 was 150 which leads me to believe that low LDL in October 2024 was due to statin therapy.    Lab Results  Component Value Date   CHOL 115 03/29/2023   HDL 44 03/29/2023   LDLCALC 57 03/29/2023   LDLDIRECT 136.0 09/26/2022   TRIG 89 03/29/2023   CHOLHDL 3 02/22/2023   Patient is noted to have diet controlled type 2 DM. His last A1c was 6.3% on 02/22/2023   Patient is due to have Complete Physical - was scheduled for October 2024 but patient cancelled.     Will forward message to PCP and also scheduler to reach out to patient to rescheduled CPE. If LDL > 100 at CPE visit recommend restarting statin.  Cecilie Coffee, PharmD Clinical Pharmacist  Primary Care [facility]

## 2024-03-12 DIAGNOSIS — R29898 Other symptoms and signs involving the musculoskeletal system: Secondary | ICD-10-CM | POA: Diagnosis not present

## 2024-03-12 DIAGNOSIS — M7542 Impingement syndrome of left shoulder: Secondary | ICD-10-CM | POA: Diagnosis not present

## 2024-04-14 ENCOUNTER — Encounter: Payer: Self-pay | Admitting: Internal Medicine

## 2024-04-14 DIAGNOSIS — E782 Mixed hyperlipidemia: Secondary | ICD-10-CM | POA: Insufficient documentation

## 2024-04-14 DIAGNOSIS — H9313 Tinnitus, bilateral: Secondary | ICD-10-CM | POA: Insufficient documentation

## 2024-04-15 ENCOUNTER — Ambulatory Visit (INDEPENDENT_AMBULATORY_CARE_PROVIDER_SITE_OTHER): Payer: Medicare (Managed Care) | Admitting: Internal Medicine

## 2024-04-15 ENCOUNTER — Encounter: Payer: Self-pay | Admitting: Internal Medicine

## 2024-04-15 VITALS — BP 136/82 | HR 91 | Temp 97.6°F | Resp 16 | Ht 68.0 in | Wt 257.4 lb

## 2024-04-15 DIAGNOSIS — Z0000 Encounter for general adult medical examination without abnormal findings: Secondary | ICD-10-CM

## 2024-04-15 DIAGNOSIS — E119 Type 2 diabetes mellitus without complications: Secondary | ICD-10-CM | POA: Diagnosis not present

## 2024-04-15 DIAGNOSIS — E785 Hyperlipidemia, unspecified: Secondary | ICD-10-CM

## 2024-04-15 NOTE — Patient Instructions (Addendum)
 GO TO THE LAB :  Get the blood work    Then, go to the front desk for the checkout Please make an appointment for a checkup in 6 months

## 2024-04-15 NOTE — Progress Notes (Unsigned)
 Subjective:    Patient ID: Travis Solomon, male    DOB: 1956/06/06, 68 y.o.   MRN: [id number]  DOS:  04/15/2024 CPX  Here for CPX. Feeling well. He remains active, plays tennis 2 or 3 times a week with no knee pain.  He however often get stiff after exertion or walking. O2 sat noted to be slightly low when he arrived, 94%, denies any symptoms.   Review of Systems See above   Past Medical History:  Diagnosis Date   DJD (degenerative joint disease) 11/24/2013   GERD (gastroesophageal reflux disease)    Hidradenitis suppurativa     Past Surgical History:  Procedure Laterality Date   KNEE ARTHROSCOPY  06/26/2010   KNEE ARTHROSCOPY Left 05/01/2022   KNEE SURGERY Right 02/19/2022   replacement   Suregery on arms Bilateral    Sweat galnds removed    Current Outpatient Medications  Medication Instructions   NON FORMULARY As needed   omeprazole  (PRILOSEC OTC) 20 mg, Daily   triamcinolone cream (KENALOG) 0.1 % As needed       Objective:   Physical Exam BP 136/82   Pulse 91   Temp 97.6 F (36.4 C) (Oral)   Resp 16   Ht 5' 8 (1.727 m)   Wt 257 lb 6 oz (116.7 kg)   SpO2 94%   BMI 39.13 kg/m  General: Well developed, NAD, BMI noted Neck: No  thyromegaly  HEENT:  Normocephalic . Face symmetric, atraumatic Lungs:  CTA B Normal respiratory effort, no intercostal retractions, no accessory muscle use. Heart: RRR,  no murmur.  Abdomen:  Not distended, soft, non-tender. No rebound or rigidity.   DM foot exam: No edema, well-perfused toes, pinprick examination normal Skin: Exposed areas without rash. Not pale. Not jaundice Neurologic:  alert & oriented X3.  Speech normal, gait appropriate for age and unassisted Strength symmetric and appropriate for age.  Psych: Cognition and judgment appear intact.  Cooperative with normal attention span and concentration.  Behavior appropriate. No anxious or depressed appearing.     Assessment     ASSESSMENT DM, A1c  6.6 (April 2024) DJD 2015: Chest pain, ETT  test negative Hidradenitis suppurativa (follow-up by Elita Dess)  PLAN Here for CPX Tdap 2019 Vaccines I recommend: Flu shot, COVID booster, PNM 20, Shingrix.  He strongly declines any vaccines, benefits discussed with patient. CCS: + Cologuard test, follow-up by a colonoscopy 05/2023, + polyps, next per GI. Prostate cancer screening.  No symptoms, check PSA.  Other issues: DM: Has a history of DM, currently on no meds.  He is very active, playing tennis 2-3 times a week, goes to the gym.  Diet is regular, encouraged a healthier diet with less carbohydrates. Will check A1c micro.  Foot exam negative. O2 sat 94%, subsequently recheck 96%, no symptoms. He also gets care at the [facility] but I do not have notes or results.  RTC 6 months    PLAN DM: Last A1c increased to 6.6.  Has diabetes, patient aware. We had a extensive discussion about what A1c means,  the role of diet and exercise.  Encouraged a low starch diet and increase fruits and vegetables. Declined a nutritionist referral.  Feet exam was negative. Check A1c and micro in 4 weeks. Dyslipidemia: Recently diagnosed with DM consequently I started him on atorva, he took it for a while without apparent side effects, ran out 2 weeks ago. Plan: Refill atorvastatin , labs in 4 weeks. Aspirin: Recommend to start 81 mg daily  Vaccine advice provided, he is strongly declining. RTC labs in 4 weeks, RTC CPX 3 months

## 2024-04-16 ENCOUNTER — Encounter: Payer: Self-pay | Admitting: Internal Medicine

## 2024-04-16 LAB — COMPREHENSIVE METABOLIC PANEL WITH GFR
ALT: 16 U/L (ref 0–53)
AST: 18 U/L (ref 0–37)
Albumin: 4.4 g/dL (ref 3.5–5.2)
Alkaline Phosphatase: 68 U/L (ref 39–117)
BUN: 21 mg/dL (ref 6–23)
CO2: 32 meq/L (ref 19–32)
Calcium: 9.4 mg/dL (ref 8.4–10.5)
Chloride: 101 meq/L (ref 96–112)
Creatinine, Ser: 2.14 mg/dL — ABNORMAL HIGH (ref 0.40–1.50)
GFR: 31.39 mL/min — ABNORMAL LOW
Glucose, Bld: 84 mg/dL (ref 70–99)
Potassium: 4.9 meq/L (ref 3.5–5.1)
Sodium: 143 meq/L (ref 135–145)
Total Bilirubin: 0.4 mg/dL (ref 0.2–1.2)
Total Protein: 7.5 g/dL (ref 6.0–8.3)

## 2024-04-16 LAB — CBC WITH DIFFERENTIAL/PLATELET
Basophils Absolute: 0.1 10*3/uL (ref 0.0–0.1)
Basophils Relative: 1.1 % (ref 0.0–3.0)
Eosinophils Absolute: 0.2 10*3/uL (ref 0.0–0.7)
Eosinophils Relative: 2.9 % (ref 0.0–5.0)
HCT: 47.5 % (ref 39.0–52.0)
Hemoglobin: 14.6 g/dL (ref 13.0–17.0)
Lymphocytes Relative: 31.1 % (ref 12.0–46.0)
Lymphs Abs: 2.4 10*3/uL (ref 0.7–4.0)
MCHC: 30.8 g/dL (ref 30.0–36.0)
MCV: 74 fl — ABNORMAL LOW (ref 78.0–100.0)
Monocytes Absolute: 0.7 10*3/uL (ref 0.1–1.0)
Monocytes Relative: 9.4 % (ref 3.0–12.0)
Neutro Abs: 4.2 10*3/uL (ref 1.4–7.7)
Neutrophils Relative %: 55.5 % (ref 43.0–77.0)
Platelets: 289 10*3/uL (ref 150.0–400.0)
RBC: 6.42 Mil/uL — ABNORMAL HIGH (ref 4.22–5.81)
RDW: 15 % (ref 11.5–15.5)
WBC: 7.6 10*3/uL (ref 4.0–10.5)

## 2024-04-16 LAB — LIPID PANEL
Cholesterol: 235 mg/dL — ABNORMAL HIGH (ref 0–200)
HDL: 47.9 mg/dL
LDL Cholesterol: 117 mg/dL — ABNORMAL HIGH (ref 0–99)
NonHDL: 187.49
Total CHOL/HDL Ratio: 5
Triglycerides: 351 mg/dL — ABNORMAL HIGH (ref 0.0–149.0)
VLDL: 70.2 mg/dL — ABNORMAL HIGH (ref 0.0–40.0)

## 2024-04-16 LAB — MICROALBUMIN / CREATININE URINE RATIO
Creatinine,U: 84.4 mg/dL
Microalb Creat Ratio: 11.8 mg/g (ref 0.0–30.0)
Microalb, Ur: 1 mg/dL (ref 0.0–1.9)

## 2024-04-16 LAB — PSA: PSA: 5.04 ng/mL — ABNORMAL HIGH (ref 0.10–4.00)

## 2024-04-16 LAB — HEMOGLOBIN A1C: Hgb A1c MFr Bld: 6.8 % — ABNORMAL HIGH (ref 4.6–6.5)

## 2024-04-16 NOTE — Assessment & Plan Note (Signed)
 Here for CPX Tdap 2019 Vaccines I recommend: Flu shot, COVID booster, PNM 20, Shingrix.  He strongly declines any vaccines, benefits discussed with patient. CCS: + Cologuard test, follow-up by a colonoscopy 05/2023, + polyps, next per GI. Prostate cancer screening.  No symptoms, check PSA.

## 2024-04-16 NOTE — Assessment & Plan Note (Signed)
 Here for CPX   Other issues: DM: Has a history of DM, currently on no meds.  He is very active, playing tennis 2-3 times a week, goes to the gym.  Diet is regular, encouraged a healthier diet with less carbohydrates. Will check A1c micro.  Foot exam negative. DJD status post knee replacement, has lower extremity stiffness after playing or or walking, recommend to start a stretching program O2 sat 94%, subsequently recheck 96%, no symptoms. He also gets care at the [facility] but I do not have notes or results. RTC 6 months

## 2024-04-17 ENCOUNTER — Ambulatory Visit: Payer: Self-pay | Admitting: Internal Medicine

## 2024-04-17 DIAGNOSIS — R972 Elevated prostate specific antigen [PSA]: Secondary | ICD-10-CM

## 2024-04-17 DIAGNOSIS — R29898 Other symptoms and signs involving the musculoskeletal system: Secondary | ICD-10-CM | POA: Diagnosis not present

## 2024-04-17 DIAGNOSIS — N189 Chronic kidney disease, unspecified: Secondary | ICD-10-CM

## 2024-04-17 DIAGNOSIS — M7542 Impingement syndrome of left shoulder: Secondary | ICD-10-CM | POA: Diagnosis not present

## 2024-04-18 NOTE — Telephone Encounter (Signed)
 Will come back in 4 weeks. BMP Dx increased creatinine or CKD UA urine culture: Increased PSA ======== I called the patient and discussed his recent blood work. - Creatinine increased.  On no meds, denies taking OTC  NSAIDs. Plan: BMP in 4 weeks, push fluids, no NSAIDs, Tylenol okay. - PSA elevated: Check UA urine culture, office visit in 3 months to recheck PSA. - Cholesterol elevated, will need to statins after above issues are taking care of  - DM, A1c decreased to 6.8, diet and exercise discussed

## 2024-04-24 ENCOUNTER — Encounter: Payer: Self-pay | Admitting: Internal Medicine

## 2024-04-24 DIAGNOSIS — R29898 Other symptoms and signs involving the musculoskeletal system: Secondary | ICD-10-CM | POA: Diagnosis not present

## 2024-04-24 DIAGNOSIS — M25512 Pain in left shoulder: Secondary | ICD-10-CM

## 2024-04-24 DIAGNOSIS — M7542 Impingement syndrome of left shoulder: Secondary | ICD-10-CM | POA: Diagnosis not present

## 2024-08-07 ENCOUNTER — Ambulatory Visit: Payer: Medicare (Managed Care)

## 2024-10-14 ENCOUNTER — Ambulatory Visit: Payer: Medicare (Managed Care) | Admitting: Internal Medicine
